# Patient Record
Sex: Female | Born: 1950 | Race: White | Hispanic: No | Marital: Married | Smoking: Never smoker
Health system: Southern US, Community
[De-identification: ages and names within clinical notes are randomized; demographics above are authoritative.]

## PROBLEM LIST (undated history)

## (undated) DIAGNOSIS — E611 Iron deficiency: Secondary | ICD-10-CM

## (undated) DIAGNOSIS — I350 Nonrheumatic aortic (valve) stenosis: Secondary | ICD-10-CM

## (undated) DIAGNOSIS — D649 Anemia, unspecified: Secondary | ICD-10-CM

## (undated) DIAGNOSIS — K219 Gastro-esophageal reflux disease without esophagitis: Secondary | ICD-10-CM

## (undated) DIAGNOSIS — I341 Nonrheumatic mitral (valve) prolapse: Secondary | ICD-10-CM

## (undated) HISTORY — DX: Iron deficiency: E61.1

## (undated) HISTORY — DX: Anemia, unspecified: D64.9

## (undated) HISTORY — DX: Nonrheumatic aortic (valve) stenosis: I35.0

## (undated) HISTORY — DX: Gastro-esophageal reflux disease without esophagitis: K21.9

## (undated) HISTORY — DX: Nonrheumatic mitral (valve) prolapse: I34.1

---
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
## 1974-06-24 HISTORY — PX: TUBAL LIGATION: SHX77

## 2002-06-24 HISTORY — PX: GASTRIC BYPASS: SHX52

## 2011-10-28 ENCOUNTER — Other Ambulatory Visit: Payer: Self-pay | Admitting: Family Medicine

## 2011-10-28 DIAGNOSIS — Z1231 Encounter for screening mammogram for malignant neoplasm of breast: Secondary | ICD-10-CM

## 2011-11-05 ENCOUNTER — Ambulatory Visit
Admission: RE | Admit: 2011-11-05 | Discharge: 2011-11-05 | Disposition: A | Discharge status: Home / Self Care | Payer: Managed Care, Other (non HMO) | Source: Ambulatory Visit | Attending: Family Medicine | Admitting: Family Medicine

## 2011-11-05 DIAGNOSIS — Z1231 Encounter for screening mammogram for malignant neoplasm of breast: Secondary | ICD-10-CM

## 2014-04-20 ENCOUNTER — Other Ambulatory Visit: Payer: Self-pay | Admitting: Family Medicine

## 2014-04-20 DIAGNOSIS — Z1231 Encounter for screening mammogram for malignant neoplasm of breast: Secondary | ICD-10-CM

## 2014-05-05 ENCOUNTER — Ambulatory Visit
Admission: RE | Admit: 2014-05-05 | Discharge: 2014-05-05 | Disposition: A | Discharge status: Home / Self Care | Payer: Managed Care, Other (non HMO) | Source: Ambulatory Visit | Attending: Family Medicine | Admitting: Family Medicine

## 2014-05-05 DIAGNOSIS — Z1231 Encounter for screening mammogram for malignant neoplasm of breast: Secondary | ICD-10-CM

## 2014-05-09 ENCOUNTER — Other Ambulatory Visit: Payer: Self-pay | Admitting: Family Medicine

## 2014-05-09 DIAGNOSIS — R928 Other abnormal and inconclusive findings on diagnostic imaging of breast: Secondary | ICD-10-CM

## 2014-05-17 ENCOUNTER — Ambulatory Visit
Admission: RE | Admit: 2014-05-17 | Discharge: 2014-05-17 | Disposition: A | Discharge status: Home / Self Care | Payer: Managed Care, Other (non HMO) | Source: Ambulatory Visit | Attending: Family Medicine | Admitting: Family Medicine

## 2014-05-17 DIAGNOSIS — R928 Other abnormal and inconclusive findings on diagnostic imaging of breast: Secondary | ICD-10-CM

## 2014-08-12 ENCOUNTER — Telehealth (HOSPITAL_COMMUNITY): Payer: Self-pay | Admitting: *Deleted

## 2014-08-12 ENCOUNTER — Ambulatory Visit (INDEPENDENT_AMBULATORY_CARE_PROVIDER_SITE_OTHER): Payer: BLUE CROSS/BLUE SHIELD | Admitting: Cardiology

## 2014-08-12 ENCOUNTER — Encounter: Payer: Self-pay | Admitting: Cardiology

## 2014-08-12 VITALS — BP 130/76 | HR 60 | Ht 63.0 in | Wt 147.8 lb

## 2014-08-12 DIAGNOSIS — R0789 Other chest pain: Secondary | ICD-10-CM

## 2014-08-12 DIAGNOSIS — R011 Cardiac murmur, unspecified: Secondary | ICD-10-CM | POA: Insufficient documentation

## 2014-08-12 DIAGNOSIS — R0600 Dyspnea, unspecified: Secondary | ICD-10-CM

## 2014-08-12 DIAGNOSIS — R072 Precordial pain: Secondary | ICD-10-CM

## 2014-08-12 NOTE — Patient Instructions (Signed)
Your physician recommends that you schedule a follow-up appointment in: 2 months with Dr. Antoine PocheHochrein  We have ordered an echo and a stress test for you to get done

## 2014-08-12 NOTE — Progress Notes (Signed)
Cardiology Office Note   Date:  08/12/2014   ID:  Vicki ChoughVirginia Hayes, DOB 1950-07-12, MRN [id number]  PCP:  Lilia ArgueKAPLAN,KRISTEN, PA-C  Cardiologist:   Rollene RotundaJames Ezrie Bunyan, MD   Chief Complaint  Patient presents with  . Heart Murmur      History of Present Illness: Vicki Hayes is a 64 y.o. female who presents for follow-up of mitral valve prolapse. She Phen Fen in the past and [place]. She was told she had mitral valve prolapse apparently some regurgitation. She has not had an echocardiogram greater than 5 years. Recently she has had an extensive workup for fatigue. She is a Financial controllerflight attendant. She has noticed increasing tiredness. She's had some dyspnea with exertion such [place] airport. She has some mild chest discomfort with all of this. She has been found to be anemic with iron deficiency. She recently had a complete upper and lower endoscopy without clear etiology. She does not have any resting symptoms. She is not describing palpitations, presyncope or syncope. She is not describing PND or orthopnea. She has had gastric bypass surgery in the past and lost about 125 pounds total. She's had no new dramatic weight gain or edema.   Past Medical History  Diagnosis Date  . Hypertension   . Anemia     Past Surgical History  Procedure Laterality Date  . Gastric bypass  06/2002  . Tubal ligation  1976  . Tonsillectomy  1963     Current Outpatient Prescriptions  Medication Sig Dispense Refill  . citalopram (CELEXA) 20 MG tablet Take 1 tablet by mouth daily.    . clonazePAM (KLONOPIN) 0.5 MG tablet Take 1 tablet by mouth as needed.    . Ferrous Sulfate (IRON) 325 (65 FE) MG TABS Take 1 tablet by mouth 2 (two) times daily.    Marland Kitchen. omeprazole (PRILOSEC) 40 MG capsule Take 1 capsule by mouth daily.    Marland Kitchen. topiramate (TOPAMAX) 50 MG tablet Take 1 tablet by mouth daily.    Marland Kitchen. ibuprofen (ADVIL,MOTRIN) 800 MG tablet Take 1 tablet by mouth  as needed.  0   No current facility-administered medications for this visit.    Allergies:   Review of patient's allergies indicates no known allergies.    Social History:  The patient  reports that she has never smoked. She does not have any smokeless tobacco history on file. She reports that she drinks alcohol. She reports that she does not use illicit drugs.   Family History:  The patient's family history includes Cancer in her father and mother.    ROS:  Please see the history of present illness.   Otherwise, review of systems are positive for reflux, cramping, leg pain..   All other systems are reviewed and negative.    PHYSICAL EXAM: VS:  BP 130/76 mmHg  Pulse 60  Ht 5\' 3"  (1.6 m)  Wt 147 lb 12.8 oz (67.042 kg)  BMI 26.19 kg/m2 , BMI Body mass index is 26.19 kg/(m^2). GENERAL:  Well appearing HEENT:  Pupils equal round and reactive, fundi not visualized, oral mucosa unremarkable NECK:  No jugular venous distention, waveform within normal limits, carotid upstroke brisk and symmetric, no bruits, no thyromegaly LYMPHATICS:  No cervical, inguinal adenopathy LUNGS:  Clear to auscultation bilaterally BACK:  No CVA tenderness CHEST:  Unremarkable HEART:  PMI not displaced or sustained,S1 and S2 within normal limits, no S3, no S4, no clicks, no rubs, 3 out of 6 apical  systolic murmur radiating out the aortic outflow tract and into the left carotid, somewhat radiating to the axilla, it seems to be somewhat holosystolic, no diastolic murmurs ABD:  Flat, positive bowel sounds normal in frequency in pitch, no bruits, no rebound, no guarding, no midline pulsatile mass, no hepatomegaly, no splenomegaly EXT:  2 plus pulses throughout, no edema, no cyanosis no clubbing SKIN:  No rashes no nodules NEURO:  Cranial nerves II through XII grossly intact, motor grossly intact throughout PSYCH:  Cognitively intact, oriented to person place and time    EKG:  EKG is ordered today. The ekg ordered  today demonstrates sinus rhythm, rate 60, axis within normal limits, intervals within normal limits, no acute ST-T wave changes.   Recent Labs: No results found for requested labs within last 365 days.    Lipid Panel No results found for: CHOL, TRIG, HDL, CHOLHDL, VLDL, LDLCALC, LDLDIRECT    Wt Readings from Last 3 Encounters:  08/12/14 147 lb 12.8 oz (67.042 kg)      Other studies Reviewed: Additional studies/ records that were reviewed today include: Outside office notes and labs. Review of the above records demonstrates:  Please see elsewhere in the note.  See above   ASSESSMENT AND PLAN:  MURMUR:  This certainly could be some progressive mitral regurgitation with anterior jet. She will need an echocardiogram to further assess. I will then decide on further management.  CHEST PAIN AND FATIGUE:  She does have some symptoms consistent with angina. I have a low pretest probability.  I will bring the patient back for a POET (Plain Old Exercise Test). This will allow me to screen for obstructive coronary disease, risk stratify and very importantly provide a prescription for exercise.    Current medicines are reviewed at length with the patient today.  The patient does not have concerns regarding medicines.  The following changes have been made:  no change  Labs/ tests ordered today include: POET (Plain Old Exercise Treadmill), echo  No orders of the defined types were placed in this encounter.     Disposition:   FU with me in two months    Signed, Rollene Rotunda, MD  08/12/2014 10:14 AM    [facility]

## 2014-09-01 ENCOUNTER — Encounter (HOSPITAL_COMMUNITY): Payer: Self-pay | Admitting: *Deleted

## 2014-09-08 ENCOUNTER — Telehealth (HOSPITAL_COMMUNITY): Payer: Self-pay

## 2014-09-08 NOTE — Telephone Encounter (Signed)
Encounter complete. 

## 2014-09-09 ENCOUNTER — Telehealth (HOSPITAL_COMMUNITY): Payer: Self-pay

## 2014-09-09 NOTE — Telephone Encounter (Signed)
Encounter complete. 

## 2014-09-13 ENCOUNTER — Ambulatory Visit (HOSPITAL_COMMUNITY)
Admission: RE | Admit: 2014-09-13 | Discharge: 2014-09-13 | Disposition: A | Discharge status: Home / Self Care | Payer: BLUE CROSS/BLUE SHIELD | Source: Ambulatory Visit | Attending: Cardiovascular Disease | Admitting: Cardiovascular Disease

## 2014-09-13 ENCOUNTER — Ambulatory Visit (HOSPITAL_BASED_OUTPATIENT_CLINIC_OR_DEPARTMENT_OTHER)
Admission: RE | Admit: 2014-09-13 | Discharge: 2014-09-13 | Disposition: A | Discharge status: Home / Self Care | Payer: BLUE CROSS/BLUE SHIELD | Source: Ambulatory Visit | Attending: Cardiovascular Disease | Admitting: Cardiovascular Disease

## 2014-09-13 DIAGNOSIS — R0600 Dyspnea, unspecified: Secondary | ICD-10-CM | POA: Insufficient documentation

## 2014-09-13 DIAGNOSIS — R0789 Other chest pain: Secondary | ICD-10-CM | POA: Diagnosis not present

## 2014-09-13 NOTE — Procedures (Signed)
Exercise Treadmill Test  Pre-Exercise Testing Evaluation Rhythm: normal sinus                  Test  Exercise Tolerance Test Ordering MD: Angelina SheriffJake Hochrein, MD    Unique Test No: 1  Treadmill:  1  Indication for ETT: chest pain - rule out ischemia  Contraindication to ETT: No   Stress Modality: exercise - treadmill  Cardiac Imaging Performed: non   Protocol: standard Bruce - maximal  Max BP:  180/124  Max MPHR (bpm):  158 85% MPR (bpm):  134  MPHR obtained (bpm):  153 % MPHR obtained:  97  Reached 85% MPHR (min:sec):  6:50 Total Exercise Time (min-sec):  8  Workload in METS:  10.1 Borg Scale: 15  Reason ETT Terminated:  Marked Diastolic HTN and SOB    ST Segment Analysis At Rest: normal ST segments - no evidence of significant ST depression With Exercise: no evidence of significant ST depression  Other Information Arrhythmia:  No Angina during ETT:  absent (0) Quality of ETT:  diagnostic  ETT Interpretation:  normal - no evidence of ischemia by ST analysis  Comments: Nl GXT  Recommendations: Follow up with Dr. Antoine PocheHochrein

## 2014-09-13 NOTE — Progress Notes (Signed)
2D Echo Performed 09/13/2014    Nusayba Cadenas, RCS  

## 2014-09-15 ENCOUNTER — Telehealth: Payer: Self-pay | Admitting: Cardiology

## 2014-09-15 NOTE — Progress Notes (Signed)
Patient scheduled for OV 4/18 @ 9am

## 2014-09-16 NOTE — Telephone Encounter (Signed)
Close encounter 

## 2014-10-10 ENCOUNTER — Ambulatory Visit: Payer: BLUE CROSS/BLUE SHIELD | Admitting: Cardiology

## 2014-10-13 ENCOUNTER — Encounter: Payer: Self-pay | Admitting: Cardiology

## 2014-10-13 ENCOUNTER — Ambulatory Visit (INDEPENDENT_AMBULATORY_CARE_PROVIDER_SITE_OTHER): Payer: BLUE CROSS/BLUE SHIELD | Admitting: Cardiology

## 2014-10-13 VITALS — BP 134/70 | HR 72 | Ht 63.0 in | Wt 149.9 lb

## 2014-10-13 DIAGNOSIS — R0602 Shortness of breath: Secondary | ICD-10-CM

## 2014-10-13 DIAGNOSIS — R0600 Dyspnea, unspecified: Secondary | ICD-10-CM | POA: Diagnosis not present

## 2014-10-13 NOTE — Progress Notes (Signed)
Cardiology Office Note   Date:  10/13/2014   ID:  Berline Chough, DOB Feb 04, 1951, MRN [id number]  PCP:  Lilia Argue  Cardiologist:   Rollene Rotunda, MD   Chief Complaint  Patient presents with  . Follow-up      History of Present Illness:  Vicki Hayes is a 64 y.o. female who presents for follow-up of mitral valve prolapse. She Phen Fen in the past.  After our first visit I did send her for an echo which demonstrated some moderate mitral regurgitation. She was having some occasional chest discomfort and still has this. However, this has been a chronic issue. She thinks it might be related to some GI problems she has. She might bring this on with activity but also happens at rest. However, I sent her for a POET (Plain Old Exercise Treadmill) And this was negative except for a hypertensive response. She has not been particularly active. 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 She will get dyspneic if she has to move quickly through the airport this has been a chronic pattern. She's not describing PND or orthopnea.   Past Medical History  Diagnosis Date  . Low iron   . GERD (gastroesophageal reflux disease)   . MVP (mitral valve prolapse)   . Anemia     Past Surgical History  Procedure Laterality Date  . Gastric bypass  06/2002  . Tubal ligation  1976  . Tonsillectomy  1963     Current Outpatient Prescriptions  Medication Sig Dispense Refill  . citalopram (CELEXA) 20 MG tablet Take 1 tablet by mouth daily.    . clonazePAM (KLONOPIN) 0.5 MG tablet Take 1 tablet by mouth as needed.    . Ferrous Sulfate (IRON) 325 (65 FE) MG TABS Take 1 tablet by mouth 2 (two) times daily.    Marland Kitchen omeprazole (PRILOSEC) 40 MG capsule Take 1 capsule by mouth daily.    Marland Kitchen topiramate (TOPAMAX) 50 MG tablet Take 1 tablet by mouth daily.     No current facility-administered medications for this visit.    Allergies:   Review of patient's allergies indicates no known  allergies.    ROS:  Please see the history of present illness.   Otherwise, review of systems are positive for reflux, cramping, leg pain..   All other systems are reviewed and negative.    PHYSICAL EXAM: VS:  BP 134/70 mmHg  Pulse 72  Ht  (1.6 m)  Wt 149 lb 14.4 oz (67.994 kg)  BMI 26.56 kg/m2 , BMI Body mass index is 26.56 kg/(m^2). GENERAL:  Well appearing NECK:  No jugular venous distention, waveform within normal limits, carotid upstroke brisk and symmetric, no bruits, no thyromegaly LUNGS:  Clear to auscultation bilaterally BACK:  No CVA tenderness CHEST:  Unremarkable HEART:  PMI not displaced or sustained,S1 and S2 within normal limits, no S3, no S4, no clicks, no rubs, 3 out of 6 apical systolic murmur radiating out the aortic outflow tract and into the left carotid, somewhat radiating to the axilla, it seems to be somewhat holosystolic, no diastolic murmurs ABD:  Flat, positive bowel sounds normal in frequency in pitch, no bruits, no rebound, no guarding, no midline pulsatile mass, no hepatomegaly, no splenomegaly EXT:  2 plus pulses throughout, no edema, no cyanosis no clubbing    Wt Readings from Last 3 Encounters:  10/13/14 149 lb 14.4 oz (67.994 kg)  08/12/14 147 lb 12.8 oz (67.042 kg)  Other studies Reviewed: Additional studies/ records that were reviewed today include: Echo, POET (Plain Old Exercise Treadmill) Review of the above records demonstrates:  Please see elsewhere in the note.  See above   ASSESSMENT AND PLAN:  MURMUR:  I will follow her in about 6 months. Probably repeat an echo 1 year from now.  CHEST PAIN AND FATIGUE:  We had a long discussion about this. At this point the pain is atypical for angina. It's a chronic pattern. She had a negative exercise treadmill test. She's going to continue to follow with her primary provider. If she has any increasing symptoms she will let me know. She is going to slowly increase her activity and start  exercise and let me know if she gets worse rather than better.  Current medicines are reviewed at length with the patient today.  The patient does not have concerns regarding medicines.  The following changes have been made:  no change  Labs/ tests ordered today include: None   Disposition:   FU with me in sixmonths    Signed, Rollene RotundaJames Shandora Koogler, MD  10/13/2014 1:12 PM    [facility]

## 2014-10-13 NOTE — Patient Instructions (Signed)
Your physician recommends that you schedule a follow-up appointment in: 4 MONTHS [facility]

## 2015-02-13 ENCOUNTER — Ambulatory Visit: Payer: BLUE CROSS/BLUE SHIELD | Admitting: Cardiology

## 2015-04-19 ENCOUNTER — Ambulatory Visit: Payer: BLUE CROSS/BLUE SHIELD | Admitting: Cardiology

## 2015-05-22 ENCOUNTER — Ambulatory Visit (INDEPENDENT_AMBULATORY_CARE_PROVIDER_SITE_OTHER): Payer: BLUE CROSS/BLUE SHIELD | Admitting: Cardiology

## 2015-05-22 ENCOUNTER — Encounter: Payer: Self-pay | Admitting: Cardiology

## 2015-05-22 VITALS — BP 142/68 | HR 75 | Ht 62.0 in | Wt 151.4 lb

## 2015-05-22 DIAGNOSIS — I340 Nonrheumatic mitral (valve) insufficiency: Secondary | ICD-10-CM

## 2015-05-22 NOTE — Progress Notes (Signed)
Cardiology Office Note   Date:  05/22/2015   ID:  Vicki Hayes, DOB 1951-06-21, MRN [id number]  PCP:  Lilia Argue  Cardiologist:   Rollene Rotunda, MD   No chief complaint on file.     History of Present Illness:  Vicki Hayes is a 64 y.o. female who presents for follow-up of mitral valve prolapse. She Phen Fen in the past.  After our first visit I did send her for an echo which demonstrated some moderate mitral regurgitation. She was having some occasional chest discomfort.  I sent her for a POET (Plain Old Exercise Treadmill).  She had no evidence of ischemia but she had a hypertensive response at the peak of exercise and felt presyncopal. She doesn't typically have this sensation doesn't get herself to that level of exertion. Since I last saw her she denies any new cardiovascular symptoms. The patient denies any new symptoms such as chest discomfort, neck or arm discomfort. There has been no new shortness of breath, PND or orthopnea. There have been no reported palpitations, presyncope or syncope.  She is not having chest discomfort that she was having.    Past Medical History  Diagnosis Date  . Low iron   . GERD (gastroesophageal reflux disease)   . MVP (mitral valve prolapse)   . Anemia     Past Surgical History  Procedure Laterality Date  . Gastric bypass  06/2002  . Tubal ligation  1976  . Tonsillectomy  1963     Current Outpatient Prescriptions  Medication Sig Dispense Refill  . citalopram (CELEXA) 20 MG tablet Take 1 tablet by mouth daily.    . clonazePAM (KLONOPIN) 0.5 MG tablet Take 1 tablet by mouth as needed.    . Ferrous Sulfate (IRON) 325 (65 FE) MG TABS Take 1 tablet by mouth 2 (two) times daily.    Marland Kitchen omeprazole (PRILOSEC) 40 MG capsule Take 1 capsule by mouth daily.     No current facility-administered medications for this visit.    Allergies:   Review of patient's allergies indicates no known allergies.    ROS:  Please see the history  of present illness.   Otherwise, review of systems are negative for other symptoms.   All other systems are reviewed and negative.    PHYSICAL EXAM: VS:  BP 142/68 mmHg  Pulse 75  Ht  (1.575 m)  Wt 151 lb 7 oz (68.692 kg)  BMI 27.69 kg/m2 , BMI Body mass index is 27.69 kg/(m^2). GENERAL:  Well appearing NECK:  No jugular venous distention, waveform within normal limits, carotid upstroke brisk and symmetric, no bruits, no thyromegaly LUNGS:  Clear to auscultation bilaterally BACK:  No CVA tenderness CHEST:  Unremarkable HEART:  PMI not displaced or sustained,S1 and S2 within normal limits, no S3, no S4, no clicks, no rubs, 3 out of 6 apical systolic murmur radiating out the aortic outflow tract and into the left carotid,  radiating to the axilla, holosystolic, no diastolic murmurs ABD:  Flat, positive bowel sounds normal in frequency in pitch, no bruits, no rebound, no guarding, no midline pulsatile mass, no hepatomegaly, no splenomegaly EXT:  2 plus pulses throughout, no edema, no cyanosis no clubbing    Wt Readings from Last 3 Encounters:  05/22/15 151 lb 7 oz (68.692 kg)  10/13/14 149 lb 14.4 oz (67.994 kg)  08/12/14 147 lb 12.8 oz (67.042 kg)      Other studies Reviewed: Additional studies/ records that were reviewed today include: None  Review of the above records demonstrates:     ASSESSMENT AND PLAN:  MURMUR:  I reviewed an echo in March.  I will repeat an echo in March.    CHEST PAIN AND FATIGUE:   There is no evidence of ischemia. Further workup is suggested. We talked about exercise.  Current medicines are reviewed at length with the patient today.  The patient does not have concerns regarding medicines.  The following changes have been made:  no change  Labs/ tests ordered today include: Echo    Disposition:   FU with me in 12 months    Signed, Rollene RotundaJames Navarro Nine, MD  05/22/2015 10:25 AM    [facility]

## 2015-05-22 NOTE — Patient Instructions (Signed)
Your physician wants you to follow-up in: 1 Year. You will receive a reminder letter in the mail two months in advance. If you don't receive a letter, please call our office to schedule the follow-up appointment.  Your physician has requested that you have an echocardiogram. Echocardiography is a painless test that uses sound waves to create images of your heart. It provides your doctor with information about the size and shape of your heart and how well your heart's chambers and valves are working. This procedure takes approximately one hour. There are no restrictions for this procedure. In March

## 2015-09-13 ENCOUNTER — Other Ambulatory Visit: Payer: Self-pay

## 2015-09-13 ENCOUNTER — Ambulatory Visit (HOSPITAL_COMMUNITY): Payer: BLUE CROSS/BLUE SHIELD | Attending: Cardiology

## 2015-09-13 DIAGNOSIS — I352 Nonrheumatic aortic (valve) stenosis with insufficiency: Secondary | ICD-10-CM | POA: Insufficient documentation

## 2015-09-13 DIAGNOSIS — I340 Nonrheumatic mitral (valve) insufficiency: Secondary | ICD-10-CM | POA: Diagnosis present

## 2015-09-13 DIAGNOSIS — I071 Rheumatic tricuspid insufficiency: Secondary | ICD-10-CM | POA: Insufficient documentation

## 2016-10-24 ENCOUNTER — Other Ambulatory Visit: Payer: Self-pay | Admitting: Family Medicine

## 2016-10-24 DIAGNOSIS — Z780 Asymptomatic menopausal state: Secondary | ICD-10-CM

## 2016-10-24 DIAGNOSIS — E2839 Other primary ovarian failure: Secondary | ICD-10-CM

## 2016-10-24 DIAGNOSIS — Z1231 Encounter for screening mammogram for malignant neoplasm of breast: Secondary | ICD-10-CM

## 2016-11-25 ENCOUNTER — Ambulatory Visit
Admission: RE | Admit: 2016-11-25 | Discharge: 2016-11-25 | Disposition: A | Discharge status: Home / Self Care | Payer: BLUE CROSS/BLUE SHIELD | Source: Ambulatory Visit | Attending: Family Medicine | Admitting: Family Medicine

## 2016-11-25 ENCOUNTER — Other Ambulatory Visit: Payer: BLUE CROSS/BLUE SHIELD

## 2016-11-25 DIAGNOSIS — Z1231 Encounter for screening mammogram for malignant neoplasm of breast: Secondary | ICD-10-CM

## 2016-11-26 ENCOUNTER — Ambulatory Visit
Admission: RE | Admit: 2016-11-26 | Discharge: 2016-11-26 | Disposition: A | Discharge status: Home / Self Care | Payer: BLUE CROSS/BLUE SHIELD | Source: Ambulatory Visit | Attending: Family Medicine | Admitting: Family Medicine

## 2016-11-26 DIAGNOSIS — Z780 Asymptomatic menopausal state: Secondary | ICD-10-CM

## 2016-11-26 DIAGNOSIS — E2839 Other primary ovarian failure: Secondary | ICD-10-CM

## 2018-03-04 ENCOUNTER — Other Ambulatory Visit: Payer: Self-pay | Admitting: Family Medicine

## 2018-03-04 DIAGNOSIS — Z1231 Encounter for screening mammogram for malignant neoplasm of breast: Secondary | ICD-10-CM

## 2018-03-05 ENCOUNTER — Telehealth: Payer: Self-pay

## 2018-03-05 NOTE — Telephone Encounter (Signed)
Referral sent to scheduling and notes filed 

## 2018-03-13 ENCOUNTER — Ambulatory Visit
Admission: RE | Admit: 2018-03-13 | Discharge: 2018-03-13 | Disposition: A | Discharge status: Home / Self Care | Payer: Medicare HMO | Source: Ambulatory Visit | Attending: Family Medicine | Admitting: Family Medicine

## 2018-03-13 ENCOUNTER — Ambulatory Visit: Payer: BLUE CROSS/BLUE SHIELD

## 2018-03-13 DIAGNOSIS — Z1231 Encounter for screening mammogram for malignant neoplasm of breast: Secondary | ICD-10-CM

## 2018-03-24 HISTORY — PX: TRANSTHORACIC ECHOCARDIOGRAM: SHX275

## 2018-04-01 ENCOUNTER — Encounter: Payer: Self-pay | Admitting: Cardiology

## 2018-04-01 ENCOUNTER — Ambulatory Visit: Payer: Medicare HMO | Admitting: Cardiology

## 2018-04-01 VITALS — BP 144/80 | HR 67 | Ht 62.0 in | Wt 153.0 lb

## 2018-04-01 DIAGNOSIS — Z8679 Personal history of other diseases of the circulatory system: Secondary | ICD-10-CM | POA: Diagnosis not present

## 2018-04-01 DIAGNOSIS — I350 Nonrheumatic aortic (valve) stenosis: Secondary | ICD-10-CM

## 2018-04-01 DIAGNOSIS — R011 Cardiac murmur, unspecified: Secondary | ICD-10-CM | POA: Diagnosis not present

## 2018-04-01 DIAGNOSIS — R072 Precordial pain: Secondary | ICD-10-CM

## 2018-04-01 NOTE — Patient Instructions (Signed)
Medication Instructions:  Your Physician recommend you continue on your current medication as directed.     If you need a refill on your cardiac medications before your next appointment, please call your pharmacy.   Lab work: None   Testing/Procedures: Your physician has requested that you have an echocardiogram. Echocardiography is a painless test that uses sound waves to create images of your heart. It provides your doctor with information about the size and shape of your heart and how well your heart's chambers and valves are working. This procedure takes approximately one hour. There are no restrictions for this procedure. [address]   Follow-Up: At [facility], you and your health needs are our priority.  As part of our continuing mission to provide you with exceptional heart care, we have created designated Provider Care Teams.  These Care Teams include your primary Cardiologist (physician) and Advanced Practice Providers (APPs -  Physician Assistants and Nurse Practitioners) who all work together to provide you with the care you need, when you need it. You will need a follow up appointment in 2 years.  Please call our office 2 months in advance to schedule this appointment.  You may see Dr. Herbie Baltimore or one of the following Advanced Practice Providers on your designated Care Team:   Theodore Demark, PA-C . Joni Reining, DNP, ANP  Any Other Special Instructions Will Be Listed Below (If Applicable).

## 2018-04-01 NOTE — Progress Notes (Signed)
PCP: Richmond Campbell., PA-C  Clinic Note: Chief Complaint  Patient presents with  . Follow-up    Almost 3-year follow-up for valvular disease, due for echo  . 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    HPI: Vicki Hayes is a 67 y.o. female who is being seen today for the follow-up evaluation of mitral valve prolapse/regurgitation at the request of Richmond Campbell., PA-C.  Vicki Hayes was last seen by Dr. Antoine Poche in this office in November 2016.  She has a history of Fen/Phen use resulting in mitral valve prolapse.  In initial evaluation in March 2016 with an echocardiogram showed moderate mitral regurgitation.  She is also evaluated with a GXT for chest discomfort that did not show any evidence of ischemia. -->  She subsequently had a follow-up echocardiogram in March 2017 that showed only mild aortic stenosis and mild by regurgitation but no significant abnormalities.  Other than moderately dilated right atrium with moderate severe tricuspid regurgitation (without suggestion of pulmonary hypertension)  Recent Hospitalizations: none  Studies Personally Reviewed - (if available, images/films reviewed: From Epic Chart or Care Everywhere)  2D Echo 08/2015:   EF 60-65%. No RWMA. GR 1 DD. Mild AS (mean gradient 10 mmHg). Mod-severe TR. Mild MR. when compared to last echo, tricuspid regurgitation was still moderate to severe another findings are relatively stable.  Interval History: Vicki is doing quite well since her last visit.  She is status post gastric bypass with notable weight loss after the operation.   For the most part she is stable from a cardiac standpoint.  She does note easy fatigue and may be mild exercise intolerance as well as chronic lower extremity edema. Cardiac review of symptoms is essentially negative:  No chest pain or shortness of breath with rest or exertion.  No PND, orthopnea or edema. No  palpitations, lightheadedness, dizziness, weakness or syncope/near syncope. No TIA/amaurosis fugax symptoms. No melena, hematochezia, hematuria, or epstaxis. No claudication.  ROS: A comprehensive was performed. Review of Systems  Constitutional: Negative for malaise/fatigue.  HENT: Negative for congestion and nosebleeds.   Eyes: Negative for blurred vision.  Respiratory: Negative for cough, shortness of breath and wheezing.   Gastrointestinal: Negative for blood in stool, constipation, heartburn, melena and nausea.  Genitourinary: Negative for hematuria.  Musculoskeletal: Negative for back pain and myalgias.  Neurological: Negative for dizziness.  Psychiatric/Behavioral: Negative for depression and memory loss. The patient is nervous/anxious.      I have reviewed and (if needed) personally updated the patient's problem list, medications, allergies, past medical and surgical history, social and family history.   Past Medical History:  Diagnosis Date  . Anemia   . GERD (gastroesophageal reflux disease)   . Low iron   . Mild aortic stenosis by prior echocardiogram   . MVP (mitral valve prolapse)    not seen on Eco 08/2015    Past Surgical History:  Procedure Laterality Date  . 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 TONSILLECTOMY  1963  . TUBAL LIGATION  1976    Current Meds  Medication Sig  . citalopram (CELEXA) 20 MG tablet Take 1 tablet by mouth daily.  . clonazePAM (KLONOPIN) 0.5 MG tablet Take 1 tablet by mouth as needed.  . Cyanocobalamin (VITAMIN B 12 PO) Take 1 tablet by mouth daily.  . Ferrous Sulfate (IRON) 325 (65 FE) MG TABS Take 1 tablet by mouth 2 (two) times daily.  . Magnesium Gluconate 500 (  27 Mg) MG TABS Take 500 mg by mouth 2 times daily.  Marland Kitchen omeprazole (PRILOSEC) 40 MG capsule Take 1 capsule by mouth daily.  Marland Kitchen topiramate (TOPAMAX) 50 MG tablet Take 50 mg by mouth 2 (two) times daily.    No Known Allergies  Social History   Tobacco Use  . Smoking status: Never  Smoker  . Smokeless tobacco: Never Used  Substance Use Topics  . Alcohol use: Yes    Alcohol/week: 28.0 standard drinks    Types: 28 Standard drinks or equivalent per week    Comment: 4 glasses / day  . Drug use: No   Social History   Social History Narrative   Lives at home with husband and granddaughter.  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  Does not get routine exercise.    family history includes Cancer in her father and mother; Leukemia in her mother.  Wt Readings from Last 3 Encounters:  04/01/18 153 lb (69.4 kg)  05/22/15 151 lb 7 oz (68.7 kg)  10/13/14 149 lb 14.4 oz (68 kg)    PHYSICAL EXAM BP (!) 144/80   Pulse 67   Ht 5\' 2"  (1.575 m)   Wt 153 lb (69.4 kg)   SpO2 99%   BMI 27.98 kg/m  Physical Exam  Constitutional: She is oriented to person, place, and time. She appears well-developed and well-nourished. No distress.  Healthy appearing.  Well-groomed  HENT:  Head: Normocephalic and atraumatic.  Eyes: Pupils are equal, round, and reactive to light. Conjunctivae and EOM are normal. No scleral icterus.  Neck: Normal range of motion. No JVD (Difficult assess, but appears to be normal) present.  Cardiovascular: Normal rate and intact distal pulses.  Occasional extrasystoles are present. Exam reveals no gallop and no friction rub.  No murmur heard. Pulmonary/Chest: Breath sounds normal. She is in respiratory distress. She has no wheezes. She has no rales.  Abdominal: Soft. Bowel sounds are normal. She exhibits distension. There is no tenderness. There is no rebound.  Musculoskeletal: Normal range of motion. She exhibits no edema.  Neurological: She is alert and oriented to person, place, and time. No cranial nerve deficit.  Skin: Skin is warm and dry. No erythema.  Psychiatric: She has a normal mood and affect. Her behavior is normal. Judgment and thought content normal.  Vitals reviewed.    Adult ECG Report  Rate: 67;  Rhythm:  normal sinus rhythm and Normal axis, intervals and durations.;   Narrative Interpretation: Normal EKG   Other studies Reviewed: Additional studies/ records that were reviewed today include:  Recent Labs:  n/a     ASSESSMENT / PLAN: Problem List Items Addressed This Visit    H/O mitral valve prolapse (Chronic)    Pending echo ordered today.  Noted back in 2016.      Relevant Orders   ECHOCARDIOGRAM COMPLETE   Mild aortic stenosis by prior echocardiogram - Primary (Chronic)    Has been 2 years since last echo to assess valvular disease. Plan: Recheck 2D echo      Relevant Orders   EKG 12-Lead (Completed)   ECHOCARDIOGRAM COMPLETE   Murmur   Relevant Orders   ECHOCARDIOGRAM COMPLETE   Precordial chest pain    Not exacerbated with activity levels.  Likely noncardiac.         Current medicines are reviewed at length with the patient today.  (+/- concerns) n/a The following changes have been made:  n/sa  Patient Instructions  Medication Instructions:  Your Physician recommend you continue on your current medication as directed.     If you need a refill on your cardiac medications before your next appointment, please call your pharmacy.   Lab work: None   Testing/Procedures: Your physician has requested that you have an echocardiogram. Echocardiography is a painless test that uses sound waves to create images of your heart. It provides your doctor with information about the size and shape of your heart and how well your heart's chambers and valves are working. This procedure takes approximately one hour. There are no restrictions for this procedure. [address]   Follow-Up: At [facility], you and your health needs are our priority.  As part of our continuing mission to provide you with exceptional heart care, we have created designated Provider Care Teams.  These Care Teams include your primary Cardiologist (physician) and Advanced Practice  Providers (APPs -  Physician Assistants and Nurse Practitioners) who all work together to provide you with the care you need, when you need it. You will need a follow up appointment in 2 years.  Please call our office 2 months in advance to schedule this appointment.  You may see Dr. Herbie Baltimore or one of the following Advanced Practice Providers on your designated Care Team:   Theodore Demark, PA-C . Joni Reining, DNP, ANP  Any Other Special Instructions Will Be Listed Below (If Applicable).    Studies Ordered:   Orders Placed This Encounter  Procedures  . EKG 12-Lead  . ECHOCARDIOGRAM COMPLETE      Bryan Lemma, M.D., M.S. Interventional Cardiologist   Pager # [phone] Phone # [phone] [address] [place], [place] [address]   Thank you for choosing Heartcare at [facility]!!

## 2018-04-03 ENCOUNTER — Encounter: Payer: Self-pay | Admitting: Cardiology

## 2018-04-03 NOTE — Assessment & Plan Note (Signed)
Pending echo ordered today.  Noted back in 2016.

## 2018-04-03 NOTE — Assessment & Plan Note (Signed)
Not exacerbated with activity levels.  Likely noncardiac.

## 2018-04-03 NOTE — Assessment & Plan Note (Signed)
Has been 2 years since last echo to assess valvular disease. Plan: Recheck 2D echo

## 2018-04-09 ENCOUNTER — Ambulatory Visit (HOSPITAL_COMMUNITY): Payer: Medicare HMO | Attending: Cardiovascular Disease

## 2018-04-09 ENCOUNTER — Other Ambulatory Visit: Payer: Self-pay

## 2018-04-09 DIAGNOSIS — Z8679 Personal history of other diseases of the circulatory system: Secondary | ICD-10-CM | POA: Diagnosis not present

## 2018-04-09 DIAGNOSIS — I350 Nonrheumatic aortic (valve) stenosis: Secondary | ICD-10-CM

## 2018-04-09 DIAGNOSIS — R011 Cardiac murmur, unspecified: Secondary | ICD-10-CM

## 2019-03-17 ENCOUNTER — Other Ambulatory Visit: Payer: Self-pay | Admitting: Family Medicine

## 2019-03-17 DIAGNOSIS — Z1231 Encounter for screening mammogram for malignant neoplasm of breast: Secondary | ICD-10-CM

## 2019-03-22 ENCOUNTER — Other Ambulatory Visit: Payer: Self-pay | Admitting: Family Medicine

## 2019-03-22 DIAGNOSIS — E2839 Other primary ovarian failure: Secondary | ICD-10-CM

## 2019-05-03 ENCOUNTER — Ambulatory Visit: Payer: Medicare HMO

## 2019-06-04 ENCOUNTER — Other Ambulatory Visit: Payer: Self-pay

## 2019-06-04 ENCOUNTER — Ambulatory Visit
Admission: RE | Admit: 2019-06-04 | Discharge: 2019-06-04 | Disposition: A | Discharge status: Home / Self Care | Payer: Medicare HMO | Source: Ambulatory Visit | Attending: Family Medicine | Admitting: Family Medicine

## 2019-06-04 DIAGNOSIS — Z1231 Encounter for screening mammogram for malignant neoplasm of breast: Secondary | ICD-10-CM

## 2019-06-04 DIAGNOSIS — E2839 Other primary ovarian failure: Secondary | ICD-10-CM

## 2020-02-10 ENCOUNTER — Telehealth: Payer: Self-pay | Admitting: Cardiology

## 2020-02-10 NOTE — Telephone Encounter (Signed)
LVM for patient to return call to get follow up scheduled with [facility] from recall list 

## 2020-04-18 ENCOUNTER — Other Ambulatory Visit: Payer: Self-pay

## 2020-04-18 ENCOUNTER — Encounter: Payer: Self-pay | Admitting: Cardiology

## 2020-04-18 ENCOUNTER — Ambulatory Visit: Payer: Medicare HMO | Admitting: Cardiology

## 2020-04-18 VITALS — BP 150/86 | HR 58 | Ht 62.0 in | Wt 174.0 lb

## 2020-04-18 DIAGNOSIS — R030 Elevated blood-pressure reading, without diagnosis of hypertension: Secondary | ICD-10-CM

## 2020-04-18 DIAGNOSIS — Z8679 Personal history of other diseases of the circulatory system: Secondary | ICD-10-CM | POA: Diagnosis not present

## 2020-04-18 DIAGNOSIS — I350 Nonrheumatic aortic (valve) stenosis: Secondary | ICD-10-CM

## 2020-04-18 NOTE — Patient Instructions (Addendum)
Medication Instructions:  No changes   Other Instructions  For next three months - when you got to [facility] or [facility]  ( anywhere with blood pressures cuff- take and keep  The reading.    Increase exercise and  Monitor eating and diet habits   Lab Work: Not needed   Testing/Procedures: Not needed   Follow-Up: At [facility], you and your health needs are our priority.  As part of our continuing mission to provide you with exceptional heart care, we have created designated Provider Care Teams.  These Care Teams include your primary Cardiologist (physician) and Advanced Practice Providers (APPs -  Physician Assistants and Nurse Practitioners) who all work together to provide you with the care you need, when you need it.    Your next appointment:   3 month(s)  The format for your next appointment:   virtual or in person  Provider:   Bryan Lemma, MD

## 2020-04-18 NOTE — Progress Notes (Signed)
Primary Care Provider: Richmond CampbellKaplan, Kristen W., PA-C Cardiologist: No primary care provider on file. Electrophysiologist: None  Clinic Note: Chief Complaint  Patient presents with   Follow-up    2-year   Cardiac Valve Problem    Mild MR and AS   HPI:    Vicki Hayes is a 69 y.o. female with a PMH below who presents today for 2-year follow-up of her mild aortic stenosis and mitral valve prolapse (diagnosed in the setting of using Fen/Phen for weight loss)..  Problem List Items Addressed This Visit    Mild aortic stenosis by prior echocardiogram - Primary (Chronic)   H/O mitral valve prolapse (Chronic)   Elevated blood pressure reading     Vicki Hayes was last seen on April 01, 2018 -> she was doing quite well at that time.  No major issues.  She had notable weight loss after her gastric bypass surgery.  Still has fatigue and may be mild exercise intolerance with chronic portion of edema. ->  Surveillance echocardiogram ordered.  (Reviewed below)  Recent Hospitalizations: None  Reviewed  CV studies:    The following studies were reviewed today: (if available, images/films reviewed: From Epic Chart or Care Everywhere)  TTE 04/09/2018: EF 55 to 60%.  Borderline diastolic function.  Mild aortic stenosis.  Mild mitral regurgitation without evidence of prolapse.  Mild right atrial dilation.  Mild to moderate TR.  Borderline elevated PA pressures.  Interval History:   Vicki Hayes returns today overall doing pretty well from cardiac standpoint.  She says she tires easily, but is somewhat deconditioned.  Has only been exercising very much.  She has definitely gained some weight in the last 2 years, which goes along with her being somewhat sedentary, especially during the COVID-19 lockdown.  She is very careful been doing much venturing out as result of the Covid fears.  She denies any active cardiac symptoms however besides exertional fatigue.  CV  Review of Symptoms (Summary): no chest pain or dyspnea on exertion positive for - Easy fatigue, exercise intolerance negative for - edema, irregular heartbeat, orthopnea, palpitations, paroxysmal nocturnal dyspnea, rapid heart rate, shortness of breath or Syncope/near syncope or TIA/amaurosis fugax, claudication  The patient does not have symptoms concerning for COVID-19 infection (fever, chills, cough, or new shortness of breath).   REVIEWED OF SYSTEMS   Review of Systems  Constitutional: Negative for malaise/fatigue (Exercise intolerance, not fatigue) and weight loss (Weight gain).  HENT: Negative for congestion and nosebleeds.   Eyes: Negative for blurred vision.  Respiratory: Negative for cough and shortness of breath.   Gastrointestinal: Negative for blood in stool, constipation, heartburn and melena.  Genitourinary: Negative for dysuria and hematuria.  Musculoskeletal: Positive for joint pain.  Neurological: Positive for dizziness (Some positional). Negative for headaches.  Psychiatric/Behavioral: Negative for depression and memory loss. The patient is nervous/anxious. The patient does not have insomnia.    I have reviewed and (if needed) personally updated the patient's problem list, medications, allergies, past medical and surgical history, social and family history.   PAST MEDICAL HISTORY   Past Medical History:  Diagnosis Date   Anemia    GERD (gastroesophageal reflux disease)    Low iron    Mild aortic stenosis by prior echocardiogram    MVP (mitral valve prolapse)    not seen on [facility] 08/2015    PAST SURGICAL HISTORY   Past Surgical History:  Procedure Laterality Date   GASTRIC BYPASS  06/2002   TONSILLECTOMY  1963   TRANSTHORACIC ECHOCARDIOGRAM  03/2018   EF 55 to 60%.  Borderline diastolic function.  Mild aortic stenosis.  Mild mitral regurgitation without evidence of prolapse.  Mild right atrial dilation.  Mild to moderate TR.  Borderline elevated PA  pressures.   TUBAL LIGATION  1976    2D Echo 08/2015:   EF 60-65%. No RWMA. GR 1 DD. Mild AS (mean gradient 10 mmHg). Mod-severe TR. Mild MR. when compared to last echo, tricuspid regurgitation was still moderate to severe another findings are relatively stable.  There is no immunization history on file for this patient.  MEDICATIONS/ALLERGIES   Current Meds  Medication Sig   citalopram (CELEXA) 20 MG tablet Take 1 tablet by mouth daily.   clonazePAM (KLONOPIN) 0.5 MG tablet Take 1 tablet by mouth as needed.   Cyanocobalamin (VITAMIN B 12 PO) Take 1 tablet by mouth daily.   GUGGULIPID-BLACK PEPPER PO Take 10 mg by mouth in the morning and at bedtime.   Lactobacillus (PROBIOTIC ACIDOPHILUS PO) Take 100 mg by mouth in the morning and at bedtime.   Magnesium Gluconate 500 (27 Mg) MG TABS Take 500 mg by mouth 2 times daily.   metroNIDAZOLE (METROGEL) 0.75 % vaginal gel Use one applicator q hs for 5 days   omeprazole (PRILOSEC) 40 MG capsule Take 1 capsule by mouth daily.   PREBIOTIC PRODUCT PO Take by mouth daily. 1 cap   topiramate (TOPAMAX) 50 MG tablet Take 50 mg by mouth 2 (two) times daily.   Turmeric (QC TUMERIC COMPLEX PO) Take 300 mg by mouth in the morning and at bedtime.    No Known Allergies  SOCIAL HISTORY/FAMILY HISTORY   Reviewed in Epic:  Pertinent findings: None  OBJCTIVE -PE, EKG, labs   Wt Readings from Last 3 Encounters:  04/18/20 174 lb (78.9 kg)  04/01/18 153 lb (69.4 kg)  05/22/15 151 lb 7 oz (68.7 kg)    Physical Exam: BP (!) 150/86    Pulse (!) 58    Ht 5\' 2"  (1.575 m)    Wt 174 lb (78.9 kg)    BMI 31.83 kg/m  Physical Exam Vitals reviewed.  Constitutional:      General: She is not in acute distress.    Appearance: Normal appearance. She is obese. She is not ill-appearing or toxic-appearing.     Comments: Healthy-appearing.  Well-groomed.  A little anxious.  Has gained weight.  HENT:     Head: Normocephalic and atraumatic.  Neck:      Vascular: No carotid bruit, hepatojugular reflux or JVD.  Cardiovascular:     Rate and Rhythm: Regular rhythm. Bradycardia present. Occasional extrasystoles are present.    Chest Wall: PMI is not displaced.     Pulses: Normal pulses and intact distal pulses. No midsystolic click and no opening snap.     Heart sounds: Murmur (Soft 1/6 COPD SEM at RUSB.  No HSM) heard.  No friction rub. No gallop.   Pulmonary:     Effort: Pulmonary effort is normal. No respiratory distress.     Breath sounds: Normal breath sounds. No wheezing, rhonchi or rales.  Chest:     Chest wall: No tenderness.  Musculoskeletal:        General: No swelling. Normal range of motion.     Cervical back: Normal range of motion and neck supple.  Skin:    General: Skin is warm and dry.  Neurological:     General: No focal deficit present.  Mental Status: She is alert and oriented to person, place, and time. Mental status is at baseline.  Psychiatric:        Mood and Affect: Mood normal.        Behavior: Behavior normal.        Thought Content: Thought content normal.        Judgment: Judgment normal.      Adult ECG Report  Rate: 58 ;  Rhythm: normal sinus rhythm and Normal axis, intervals and durations.;   Narrative Interpretation: Normal EKG  Recent Labs: Care Everywhere Component 03/17/19 03/04/18  LDL Direct 132High 137High  Total Cholesterol 220High 232High  Triglycerides 77 108  HDL Cholesterol 79 95  Total Chol / HDL Cholesterol 2.8 2.4  Non-HDL Cholesterol 141  137    Component 03/17/19 03/04/18  Sodium 140 141  Potassium 4.0 4.6  Chloride 108 105  CO2 27 30  BUN 11 12  Glucose 93 87  Creatinine 0.84 0.91  Calcium 9.0 9.0  Total Protein 6.2 6.1  Albumin  4.1 3.9  Total Bilirubin 1.0 0.8  Alkaline Phosphatase 64 61  AST (SGOT) 19 18  ALT (SGPT) 18 17  Anion Gap 5 6  Est. GFR Non-African American 72  66     In Epic No results found for: CHOL, HDL, LDLCALC, LDLDIRECT, TRIG,  CHOLHDL No results found for: CREATININE, BUN, NA, K, CL, CO2 No results found for: TSH  ASSESSMENT/PLAN    Problem List Items Addressed This Visit    Mild aortic stenosis by prior echocardiogram - Primary (Chronic)    Pretty stable finding in 2019.  I think we can wait until 2022 to recheck.  Murmur sounds relatively benign.  Weeks from this year try to get her blood pressure controlled.      Relevant Orders   EKG 12-Lead (Completed)   H/O mitral valve prolapse (Chronic)    This diagnosis was probably spuriously given the setting of Fen/Phen.  It is possible that there was some prolapse, but nothing noted on follow-up echoes.  She does have mild MR, but not auscultated on exam.      Elevated blood pressure reading    Pressure is high today.  She is not sure what he usually runs at home.  Has not been checking it.  I have asked that she monitor [facility] and keep a BP [facility].  We will reassess in 3 months.  She is not currently on any blood pressure medications I would like to try to avoid doing so unless her pressures are still elevated.      Relevant Orders   EKG 12-Lead (Completed)      COVID-19 Education: The signs and symptoms of COVID-19 were discussed with the patient and how to seek care for testing (follow up with PCP or arrange E-visit).   The importance of social distancing and COVID-19 vaccination was discussed today.  The patient is practicing social distancing & Masking.   I spent a total of with the patient spent in direct patient consultation.  Additional time spent with chart review  / charting (studies, outside notes, etc): 12--> 2 years of chart reviewed for Relative data. - Labs found in Care Everywhere  Total Time: 41 min   Current medicines are reviewed at length with the patient today.  (+/- concerns) n/a  This visit occurred during the SARS-CoV-2 public health emergency.  Safety protocols were in place, including screening questions  prior to the  visit, additional usage of staff PPE, and extensive cleaning of exam room while observing appropriate contact time as indicated for disinfecting solutions.  Notice: This dictation was prepared with Dragon dictation along with smaller phrase technology. Any transcriptional errors that result from this process are unintentional and may not be corrected upon review.  Patient Instructions / Medication Changes & Studies & Tests Ordered   Patient Instructions  Medication Instructions:  No changes   Other Instructions  For next three months - when [facility]  ( anywhere with blood pressures cuff- take and keep  The reading.    Increase exercise and  Monitor eating and diet habits   Lab Work: Not needed   Testing/Procedures: Not needed   Follow-Up: At [facility], you and your health needs are our priority.  As part of our continuing mission to provide you with exceptional heart care, we have created designated Provider Care Teams.  These Care Teams include your primary Cardiologist (physician) and Advanced Practice Providers (APPs -  Physician Assistants and Nurse Practitioners) who all work together to provide you with the care you need, when you need it.    Your next appointment:   3 month(s)  The format for your next appointment:   virtual or in person  Provider:   Bryan Lemma, MD       Studies Ordered:   Orders Placed This Encounter  Procedures   EKG 12-Lead     Bryan Lemma, M.D., M.S. Interventional Cardiologist   Pager # [phone] Phone # [phone] [address] [place], [place] [address]   Thank you for choosing Heartcare at [facility]!!

## 2020-04-28 ENCOUNTER — Encounter: Payer: Self-pay | Admitting: Cardiology

## 2020-04-28 NOTE — Assessment & Plan Note (Signed)
Pretty stable finding in 2019.  I think we can wait until 2022 to recheck.  Murmur sounds relatively benign.  Weeks from this year try to get her blood pressure controlled.

## 2020-04-28 NOTE — Assessment & Plan Note (Signed)
This diagnosis was probably spuriously given the setting of Fen/Phen.  It is possible that there was some prolapse, but nothing noted on follow-up echoes.  She does have mild MR, but not auscultated on exam.

## 2020-04-28 NOTE — Assessment & Plan Note (Signed)
Pressure is high today.  She is not sure what he usually runs at home.  Has not been checking it.  I have asked that she monitor [facility] and keep a BP log.  We will reassess in 3 months.  She is not currently on any blood pressure medications I would like to try to avoid doing so unless her pressures are still elevated.

## 2020-06-29 ENCOUNTER — Other Ambulatory Visit: Payer: Self-pay | Admitting: Family Medicine

## 2020-06-29 DIAGNOSIS — Z1231 Encounter for screening mammogram for malignant neoplasm of breast: Secondary | ICD-10-CM

## 2020-07-20 ENCOUNTER — Ambulatory Visit: Payer: Medicare HMO | Admitting: Cardiology

## 2020-07-20 ENCOUNTER — Other Ambulatory Visit: Payer: Self-pay

## 2020-07-20 ENCOUNTER — Encounter: Payer: Self-pay | Admitting: Cardiology

## 2020-07-20 VITALS — BP 140/72 | HR 72 | Ht 62.0 in | Wt 166.2 lb

## 2020-07-20 DIAGNOSIS — R011 Cardiac murmur, unspecified: Secondary | ICD-10-CM

## 2020-07-20 DIAGNOSIS — I350 Nonrheumatic aortic (valve) stenosis: Secondary | ICD-10-CM | POA: Diagnosis not present

## 2020-07-20 DIAGNOSIS — E7849 Other hyperlipidemia: Secondary | ICD-10-CM | POA: Insufficient documentation

## 2020-07-20 DIAGNOSIS — R030 Elevated blood-pressure reading, without diagnosis of hypertension: Secondary | ICD-10-CM | POA: Diagnosis not present

## 2020-07-20 DIAGNOSIS — Z8679 Personal history of other diseases of the circulatory system: Secondary | ICD-10-CM | POA: Diagnosis not present

## 2020-07-20 NOTE — Progress Notes (Signed)
Primary Care Provider: Richmond Campbell., PA-C Cardiologist: No primary care provider on file. Electrophysiologist: None  Clinic Note: Chief Complaint  Patient presents with  . Follow-up    Doing well.  Working on losing weight.  Borderline BP.  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  Would be due to follow-up in the fall of this year.  Murmur sounds relatively benign.  As long as it stays mild, would simply follow every 3 years.  Plan: 2D echo in October       Relevant Orders   ECHOCARDIOGRAM COMPLETE   H/O mitral valve prolapse (Chronic)    I do not think she had any evidence of mitral prolapse or MR on her last echo.  Nothing on exam.  We are following her aortic stenosis with an echo this fall (October).      Relevant Orders   ECHOCARDIOGRAM COMPLETE   Elevated blood pressure reading (Chronic)    She has had borderline blood pressures, but today was 140/72 which by the new guidelines would be considered hypertension.  She tells me at home her pressures range from 134/84-140 3/90.  This would be considered stage I hypertension.  She really wants to avoid taking a blood pressure medication.  Again dietary modification with exercise and weight loss should help her pressures.  She is going to monitor pressures, I told her that her goal pressures are less than 135/85 on average.  If they are ranging above that, then we probably need to treat.  She was given amlodipine 5 mg daily.  I told her that if she has pressures over the threshold of 145/90, she should take a dose of amlodipine.  If her averages remain above 135/84 mmHg, then we do need to think about treating      Hyperlipidemia due to dietary fat intake (Chronic)    I stressed the importance of lipid management  to her.  She is very reluctant to go on any medications.  I explained that probably the one major feature that could make her error stenosis worse  other than blood pressure would be her lipids.  She really does not want to take a statin.  She does not want to try other agents as well. Her most recent lipids showed LDL of 145.  Target for her should be LDL less than 100.  We discussed OTC options including Krill oil 3 g a day along with co-Q10 and potentially red yeast rice.  We also talked out continued diet and exercise and dietary modifications to avoid lipids. She should be due to get lipids checked in October November timeframe.  If not at goal, I think we need to consider medications.        ===================================  HPI:    Vicki Hayes is a 70 y.o. female with a PMH notable for mild aortic stenosis and mitral prolapse (diagnosed in the setting of Fen/Phen use) as well as hypertension who presents today for 19-month follow-up.  [place] Bhargava was last seen on 04/18/2020-doing well from cardiac standpoint.  She has noted some easy fatigue and exercise intolerance.  Some positional dizziness as well as anxiety and joint pain.  Has gained weight -> Plan was to wait to 2022 to recheck echocardiogram.  We wanted to  reassess blood pressure in 3 months.  Recent Hospitalizations: None  Reviewed  CV studies:    The following studies were reviewed today: (if available, images/films reviewed: From Epic Chart or Care Everywhere) . None:   Interval History:   Vicki Hayes is here today for follow-up overall doing pretty well.  She says that her PCP started amlodipine 5 mg daily for blood pressure, but she has not yet started it.  She really did not want take any medication.  She tells me at home her pressures run anywhere from 134/84-143/90 (based on her log).  She is now making an effort to adjust her diet and has increased her exercise level.  She is  lost 10 pounds.  She is trying to stay off of any additional medications.  She is doing fine overall from a cardiac standpoint with no major complaints.  She is doing well with her exercise denying any chest tightness pressure or dyspnea.  No heart failure symptoms of PND, orthopnea or edema.  Remainder of CV Review of Symptoms (Summary) : no chest pain or dyspnea on exertion positive for - Occasional high BP readings negative for - edema, irregular heartbeat, orthopnea, palpitations, paroxysmal nocturnal dyspnea, rapid heart rate, shortness of breath or Lightheadedness or dizziness, syncope/near syncope or TIA/amaurosis fugax, claudication  The patient does not have symptoms concerning for COVID-19 infection (fever, chills, cough, or new shortness of breath).   REVIEWED OF SYSTEMS   Review of Systems  Constitutional: Positive for weight loss (Intentional 10 pounds). Negative for malaise/fatigue.  HENT: Negative for congestion and nosebleeds.   Respiratory: Negative for cough and sputum production.   Gastrointestinal: Negative for blood in stool, constipation, diarrhea and melena.  Genitourinary: Negative for hematuria.  Musculoskeletal: Positive for joint pain (Mild aches and pains but nothing significant).  Neurological: Negative for dizziness and headaches.  Psychiatric/Behavioral: Negative for memory loss. The patient is not nervous/anxious and does not have insomnia.    I have reviewed and (if needed) personally updated the patient's problem list, medications, allergies, past medical and surgical history, social and family history.   PAST MEDICAL HISTORY   Past Medical History:  Diagnosis Date  . Anemia   . GERD (gastroesophageal reflux disease)   . Low iron   . Mild aortic stenosis by prior echocardiogram   . MVP (mitral valve prolapse)    not seen on [facility] 08/2015    PAST SURGICAL HISTORY   Past Surgical History:  Procedure Laterality Date  . 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  TONSILLECTOMY  1963  . TRANSTHORACIC ECHOCARDIOGRAM  03/2018   EF 55 to 60%.  Borderline diastolic function.  Mild aortic stenosis.  Mild mitral regurgitation without evidence of prolapse.  Mild right atrial dilation.  Mild to moderate TR.  Borderline elevated PA pressures.  . TUBAL LIGATION  1976    There is no immunization history on file for this patient.  MEDICATIONS/ALLERGIES   Current Meds  Medication Sig  . citalopram (CELEXA) 20 MG tablet Take 1 tablet by mouth daily.  . clonazePAM (KLONOPIN) 0.5 MG tablet Take 1 tablet by mouth as needed.  . Cyanocobalamin (VITAMIN B 12 PO) Take 1 tablet by mouth daily.  [place] PEPPER PO Take 10 mg by mouth in the morning and at bedtime.  . Lactobacillus (PROBIOTIC ACIDOPHILUS PO) Take 100 mg by mouth in the morning and at bedtime.  . Magnesium Gluconate 500 (27 Mg) MG TABS Take 500 mg by mouth 2 times daily.  Marland Kitchen  metroNIDAZOLE (METROGEL) 0.75 % vaginal gel Use one applicator q hs for 5 days  . omeprazole (PRILOSEC) 40 MG capsule Take 1 capsule by mouth daily.  Marland Kitchen PREBIOTIC PRODUCT PO Take by mouth daily. 1 cap  . topiramate (TOPAMAX) 50 MG tablet Take 50 mg by mouth 2 (two) times daily.  . Turmeric (QC TUMERIC COMPLEX PO) Take 300 mg by mouth in the morning and at bedtime.    No Known Allergies  SOCIAL HISTORY/FAMILY HISTORY   Reviewed in Epic:  Pertinent findings:  Social History   Tobacco Use  . Smoking status: Never Smoker  . Smokeless tobacco: Never Used  Substance Use Topics  . Alcohol use: Yes    Alcohol/week: 28.0 standard drinks    Types: 28 Standard drinks or equivalent per week    Comment: 4 glasses / day  . Drug use: No   Social History   Social History Narrative   Lives at home with husband and granddaughter.  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  Does not get routine exercise.    OBJCTIVE -PE, EKG, labs   Wt Readings from Last 3 Encounters:  07/20/20 166 lb 3.2 oz (75.4  kg)  04/18/20 174 lb (78.9 kg)  04/01/18 153 lb (69.4 kg)    Physical Exam: BP 140/72   Pulse 72   Ht 5\' 2"  (1.575 m)   Wt 166 lb 3.2 oz (75.4 kg)   SpO2 96%   BMI 30.40 kg/m  Physical Exam Vitals reviewed.  Constitutional:      General: She is not in acute distress.    Appearance: Normal appearance. She is obese. She is not ill-appearing (Healthy-appearing), toxic-appearing or diaphoretic.  HENT:     Head: Normocephalic and atraumatic.  Neck:     Vascular: No carotid bruit, hepatojugular reflux or JVD.  Cardiovascular:     Rate and Rhythm: Normal rate and regular rhythm.  No extrasystoles are present.    Chest Wall: PMI is not displaced.     Pulses: Normal pulses.     Heart sounds: Heart sounds not distant. Murmur (1/6C-D SEM at RUSB.  No HSM heard) heard.  No friction rub. No gallop.   Pulmonary:     Effort: Pulmonary effort is normal. No respiratory distress.     Breath sounds: Normal breath sounds.  Chest:     Chest wall: No tenderness.  Musculoskeletal:        General: No swelling. Normal range of motion.     Cervical back: Normal range of motion and neck supple.  Skin:    General: Skin is warm and dry.  Neurological:     General: No focal deficit present.     Mental Status: She is alert and oriented to person, place, and time. Mental status is at baseline.     Motor: No weakness.     Gait: Gait normal.  Psychiatric:        Mood and Affect: Mood normal.        Behavior: Behavior normal.        Thought Content: Thought content normal.        Judgment: Judgment normal.     Adult ECG Report Not checked  Recent Labs: Labs from September 2020 recorded in last note.  Reviewed.  05/15/2020: A1c 5.1, TSH 3.23 Comprehensive Metabolic Panel Component 05/15/20 03/17/19 03/04/18  Sodium 140 140 141  Potassium 4.6 4.0 4.6  Chloride 106 108 105  CO2 29 27 30  BUN 9 11 12   Glucose 94  93 87  Creatinine 0.95 0.84 0.91  Calcium 9.2 9.0 9.0  Total Protein 6.3  6.2  6.1  Albumin  4.0 4.1 3.9  Total Bilirubin 1.1  1.0 0.8  Alkaline Phosphatase 75 64 61  AST (SGOT) 20 19 18   ALT (SGPT) 16 18 17   Anion Gap 5 5 6   Est. GFR Non-African American 62  72  66    Lipid Profile Component 05/15/20 03/17/19 03/04/18  LDL Direct 145 132High 137High  Total Cholesterol 240 220High 232High  Triglycerides 93 77 108  HDL Cholesterol 86 79 95  Total Chol / HDL Cholesterol 2.8 2.8 2.4   CBC and Differential Component 05/15/20 03/17/19 03/04/18  WBC 3.3 4.2Low 3.6Low  RBC 4.06 3.96Low 4.01Low  Hemoglobin 13.5 13.6 13.0  Hematocrit 40.8 40.5 39.6  MCV 100.3 102.1High 98.8  MCH 33.3 34.2High 32.4High  MCHC 33.2 33.5 32.8Low  RDW 13.9 13.5 14.4  Platelets 198 208 182      No results found for: CHOL, HDL, LDLCALC, LDLDIRECT, TRIG, CHOLHDL No results found for: CREATININE, BUN, NA, K, CL, CO2 No flowsheet data found.  No results found for: TSH  ==================================================  COVID-19 Education: The signs and symptoms of COVID-19 were discussed with the patient and how to seek care for testing (follow up with PCP or arrange E-visit).   The importance of social distancing and COVID-19 vaccination was discussed today. The patient is practicing social distancing & Masking.   I spent a total of 35 minutes with the patient spent in direct patient consultation.  Additional time spent with chart review  / charting (studies, outside notes, etc): 15 min Total Time:   Current medicines are reviewed at length with the patient today.  (+/- concerns) n/a  This visit occurred during the SARS-CoV-2 public health emergency.  Safety protocols were in place, including screening questions prior to the visit, additional usage of staff PPE, and extensive cleaning of exam room while observing appropriate contact time as indicated for disinfecting solutions.  Notice: This dictation was prepared with Dragon dictation  along with smaller phrase technology. Any transcriptional errors that result from this process are unintentional and may not be corrected upon review.  Patient Instructions / Medication Changes & Studies & Tests Ordered   Patient Instructions  Medication Instructions:   UPPER LIMIT OFTARGET BLOOD PRESSURE IS 135/85-   MAY USE amlodipine  FOR 2 TO 3 DAYS AS NEEDED FOR BLOOD PRESSURE SYSTOLIC IS GREATER THAN 150/.  MEDICATION YOU CAN USE TO HELP BRING LDL NUMBERS FISHOIIL TABLETS OR KRILL OIL  3 GRAMS IN A DAY   AND CoQ10 up to 300 mg a day  *If you need a refill on your cardiac medications before your next appointment, please call your pharmacy*   Lab Work:  IN [place] /NOV 2022 - HAVE LABS DONE EITHER AT PRIMARY OR DR Ilija Maxim'S OFFICE CMP, FASTING LIPID  If you have labs (blood work) drawn today and your tests are completely normal, you will receive your results only by: Marland Kitchen MyChart Message (if you have MyChart) OR . A paper copy in the mail If you have any lab test that is abnormal or we need to change your treatment, we will call you to review the results.   Testing/Procedures:  WILL BE SCHEDULE IN OCT 2022 AT [address] Your physician has requested that you have an echocardiogram. Echocardiography is a painless test that uses sound waves  to create images of your heart. It provides your doctor with information about the size and shape of your heart and how well your heart's chambers and valves are working. This procedure takes approximately one hour. There are no restrictions for this procedure.     Follow-Up: At [facility], you and your health needs are our priority.  As part of our continuing mission to provide you with exceptional heart care, we have created designated Provider Care Teams.  These Care Teams include your primary Cardiologist (physician) and Advanced Practice Providers (APPs -  Physician Assistants and Nurse Practitioners) who all work  together to provide you with the care you need, when you need it.  We recommend signing up for the patient portal called "MyChart".  Sign up information is provided on this After Visit Summary.  MyChart is used to connect with patients for Virtual Visits (Telemedicine).  Patients are able to view lab/test results, encounter notes, upcoming appointments, etc.  Non-urgent messages can be sent to your provider as well.   To learn more about what you can do with MyChart, go to [email or web address].    Your next appointment:   10 month(s) NOV 2022  The format for your next appointment:   In Person  Provider:   Bryan Lemmaavid Velencia Lenart, MD   Other Instructions---  see medication instructions    Studies Ordered:   Orders Placed This Encounter  Procedures  . ECHOCARDIOGRAM COMPLETE     Bryan Lemmaavid Alanah Sakuma, M.D., M.S. Interventional Cardiologist   Pager # [phone] Phone # [phone] [address] [place], [place] [address]   Thank you for choosing [facility] at [facility]!!

## 2020-07-20 NOTE — Patient Instructions (Addendum)
Medication Instructions:   UPPER LIMIT OFTARGET BLOOD PRESSURE IS 135/85-   MAY USE amlodipine  FOR 2 TO 3 DAYS AS NEEDED FOR BLOOD PRESSURE SYSTOLIC IS GREATER THAN 150/.  MEDICATION YOU CAN USE TO HELP BRING LDL NUMBERS FISHOIIL TABLETS OR KRILL OIL  3 GRAMS IN A DAY   AND CoQ10 up to 300 mg a day  *If you need a refill on your cardiac medications before your next appointment, please call your pharmacy*   Lab Work:  IN [place] /NOV 2022 - HAVE LABS DONE EITHER AT PRIMARY OR DR HARDING'S OFFICE CMP, FASTING LIPID  If you have labs (blood work) drawn today and your tests are completely normal, you will receive your results only by: Marland Kitchen MyChart Message (if you have MyChart) OR . A paper copy in the mail If you have any lab test that is abnormal or we need to change your treatment, we will call you to review the results.   Testing/Procedures:  WILL BE SCHEDULE IN OCT 2022 AT [address] Your physician has requested that you have an echocardiogram. Echocardiography is a painless test that uses sound waves to create images of your heart. It provides your doctor with information about the size and shape of your heart and how well your heart's chambers and valves are working. This procedure takes approximately one hour. There are no restrictions for this procedure.     Follow-Up: At [facility], you and your health needs are our priority.  As part of our continuing mission to provide you with exceptional heart care, we have created designated Provider Care Teams.  These Care Teams include your primary Cardiologist (physician) and Advanced Practice Providers (APPs -  Physician Assistants and Nurse Practitioners) who all work together to provide you with the care you need, when you need it.  We recommend signing up for the patient portal called "MyChart".  Sign up information is provided on this After Visit Summary.  MyChart is used to connect with patients for Virtual  Visits (Telemedicine).  Patients are able to view lab/test results, encounter notes, upcoming appointments, etc.  Non-urgent messages can be sent to your provider as well.   To learn more about what you can do with MyChart, go to [email or web address].    Your next appointment:   10 month(s) NOV 2022  The format for your next appointment:   In Person  Provider:   Bryan Lemma, MD   Other Instructions---  see medication instructions

## 2020-08-03 ENCOUNTER — Encounter: Payer: Self-pay | Admitting: Cardiology

## 2020-08-03 NOTE — Assessment & Plan Note (Signed)
I do not think she had any evidence of mitral prolapse or MR on her last echo.  Nothing on exam.  We are following her aortic stenosis with an echo this fall (October).

## 2020-08-03 NOTE — Assessment & Plan Note (Signed)
She has had borderline blood pressures, but today was 140/72 which by the new guidelines would be considered hypertension.  She tells me at home her pressures range from 134/84-140 3/90.  This would be considered stage I hypertension.  She really wants to avoid taking a blood pressure medication.  Again dietary modification with exercise and weight loss should help her pressures.  She is going to monitor pressures, I told her that her goal pressures are less than 135/85 on average.  If they are ranging above that, then we probably need to treat.  She was given amlodipine 5 mg daily.  I told her that if she has pressures over the threshold of 145/90, she should take a dose of amlodipine.  If her averages remain above 135/84 mmHg, then we do need to think about treating

## 2020-08-03 NOTE — Assessment & Plan Note (Signed)
I stressed the importance of lipid management to her.  She is very reluctant to go on any medications.  I explained that probably the one major feature that could make her error stenosis worse  other than blood pressure would be her lipids.  She really does not want to take a statin.  She does not want to try other agents as well. Her most recent lipids showed LDL of 145.  Target for her should be LDL less than 100.  We discussed OTC options including Krill oil 3 g a day along with co-Q10 and potentially red yeast rice.  We also talked out continued diet and exercise and dietary modifications to avoid lipids. She should be due to get lipids checked in October November timeframe.  If not at goal, I think we need to consider medications.

## 2020-08-03 NOTE — Assessment & Plan Note (Addendum)
Stable finding on echo in 2019.  Would be due to follow-up in the fall of this year.  Murmur sounds relatively benign.  As long as it stays mild, would simply follow every 3 years.  Plan: 2D echo in October

## 2020-08-09 ENCOUNTER — Other Ambulatory Visit: Payer: Self-pay

## 2020-08-09 ENCOUNTER — Ambulatory Visit
Admission: RE | Admit: 2020-08-09 | Discharge: 2020-08-09 | Disposition: A | Discharge status: Home / Self Care | Payer: Medicare (Managed Care) | Source: Ambulatory Visit | Attending: Family Medicine | Admitting: Family Medicine

## 2020-08-09 DIAGNOSIS — Z1231 Encounter for screening mammogram for malignant neoplasm of breast: Secondary | ICD-10-CM

## 2021-04-19 ENCOUNTER — Ambulatory Visit (HOSPITAL_COMMUNITY): Payer: Medicare (Managed Care) | Attending: Internal Medicine

## 2021-04-19 ENCOUNTER — Other Ambulatory Visit: Payer: Self-pay

## 2021-04-19 DIAGNOSIS — Z8679 Personal history of other diseases of the circulatory system: Secondary | ICD-10-CM | POA: Insufficient documentation

## 2021-04-19 DIAGNOSIS — I350 Nonrheumatic aortic (valve) stenosis: Secondary | ICD-10-CM | POA: Insufficient documentation

## 2021-04-20 LAB — ECHOCARDIOGRAM COMPLETE
AR max vel: 1.7 cm2
AV Area VTI: 1.6 cm2
AV Area mean vel: 1.58 cm2
AV Mean grad: 14 mmHg
AV Peak grad: 23.8 mmHg
Ao pk vel: 2.44 m/s
Area-P 1/2: 4.06 cm2
P 1/2 time: 531 msec
S' Lateral: 2.2 cm

## 2021-04-23 ENCOUNTER — Telehealth: Payer: Self-pay | Admitting: *Deleted

## 2021-04-23 NOTE — Telephone Encounter (Signed)
-----   Message from Marykay Lex, MD sent at 04/21/2021  6:53 PM EDT ----- Follow-up echocardiogram result: Very stable echocardiogram.  Good news!  Hyperdynamic left ventricle with ejection fraction over 75%.  No wall motion normalities.  Aortic valve continues to show evidence of Mild Aortic Stenosis.  Mean gradient stable at 14 mmHg) other variables are also consistent with mild stenosis. ->  No significant change compared to last echo.  --> Very hyperdynamic left ventricle-would suggest that she is quite symptomatic if she were to become dehydrated.  She stays adequately hydrated.  Bryan Lemma, MD

## 2021-04-23 NOTE — Telephone Encounter (Signed)
Attempted to call pt. No answer. Lmtcb.  

## 2021-04-24 ENCOUNTER — Telehealth: Payer: Self-pay | Admitting: *Deleted

## 2021-04-24 NOTE — Telephone Encounter (Signed)
Patient reviewed mychart  result of echo  on 10 30/22

## 2021-04-24 NOTE — Telephone Encounter (Signed)
Opened error

## 2021-04-24 NOTE — Telephone Encounter (Signed)
This is not a patient of [facility] patient. Routing to Dr. Erich Montane nurse at [facility].

## 2021-08-14 IMAGING — MG MM DIGITAL SCREENING BILAT W/ TOMO AND CAD
8 series · 9 of 24 positions shown · non-contrast
Comparison: Previous exam(s).

CLINICAL DATA: Screening.

EXAM:
DIGITAL SCREENING BILATERAL MAMMOGRAM WITH TOMOSYNTHESIS AND CAD
TECHNIQUE: Bilateral screening digital craniocaudal and mediolateral oblique
mammograms were obtained. Bilateral screening digital breast
tomosynthesis was performed. The images were evaluated with
computer-aided detection.

[R MLO synth-2D]
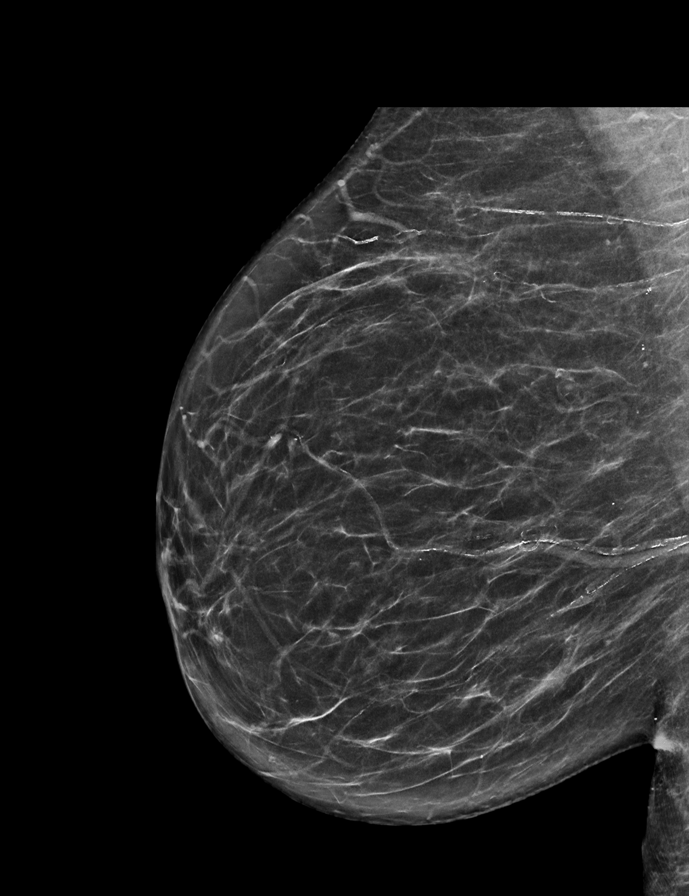

[R CC synth-2D]
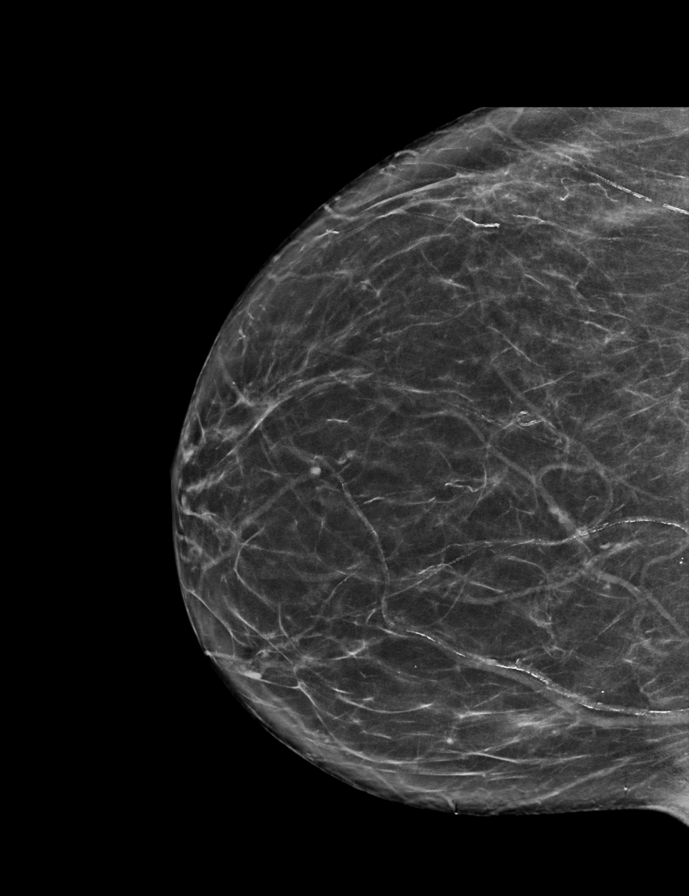

[L MLO synth-2D]
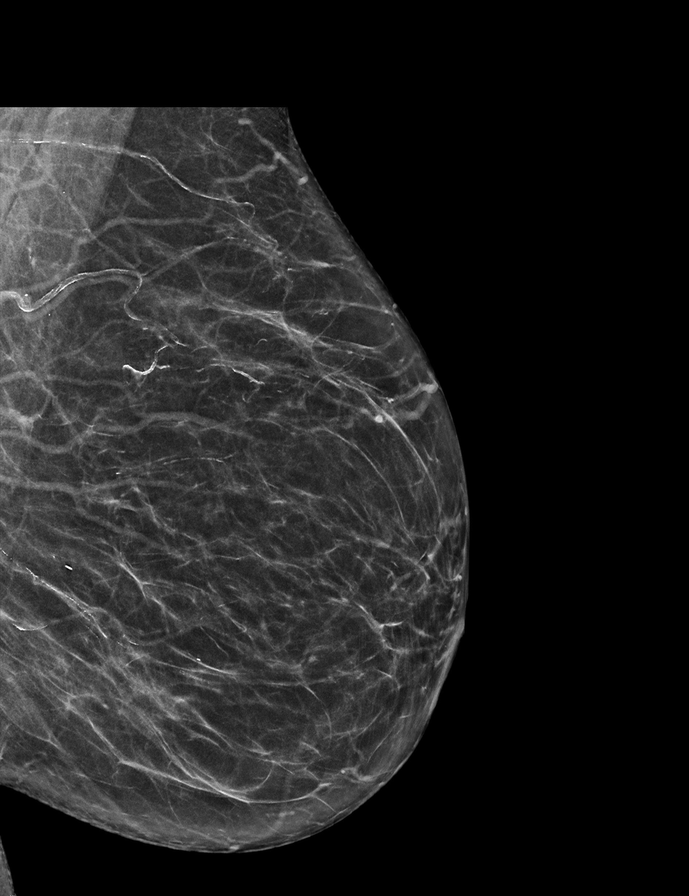

[L CC synth-2D]
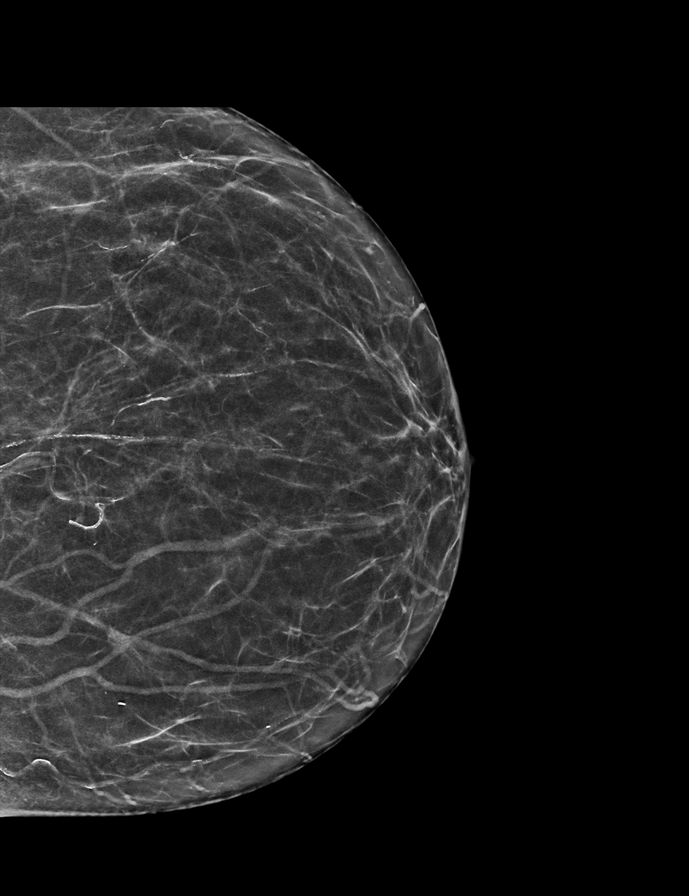

[L MLO tomo · 2 of 63 frames shown]
[frame 21/63]
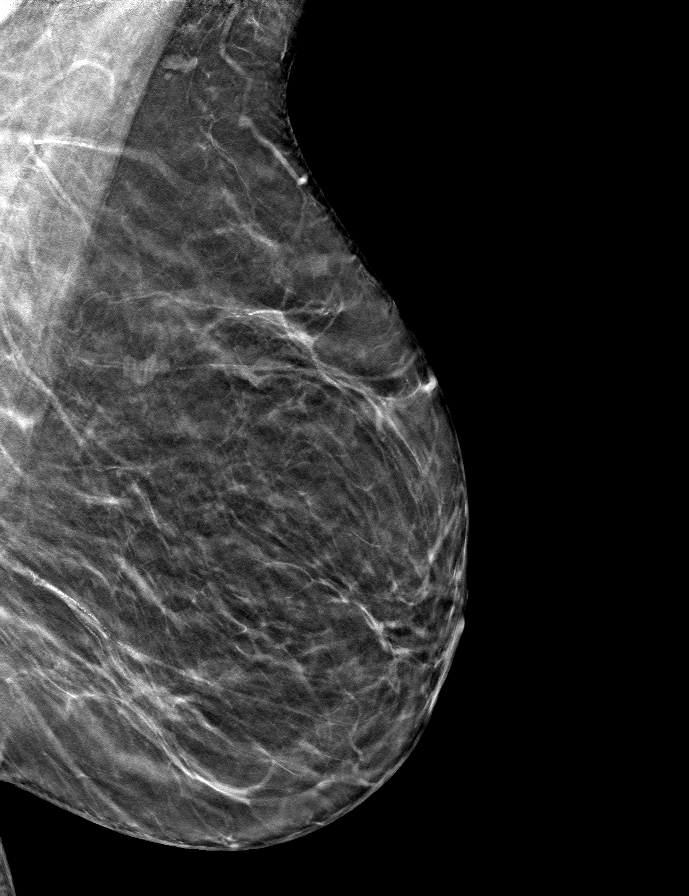
[frame 32/63]
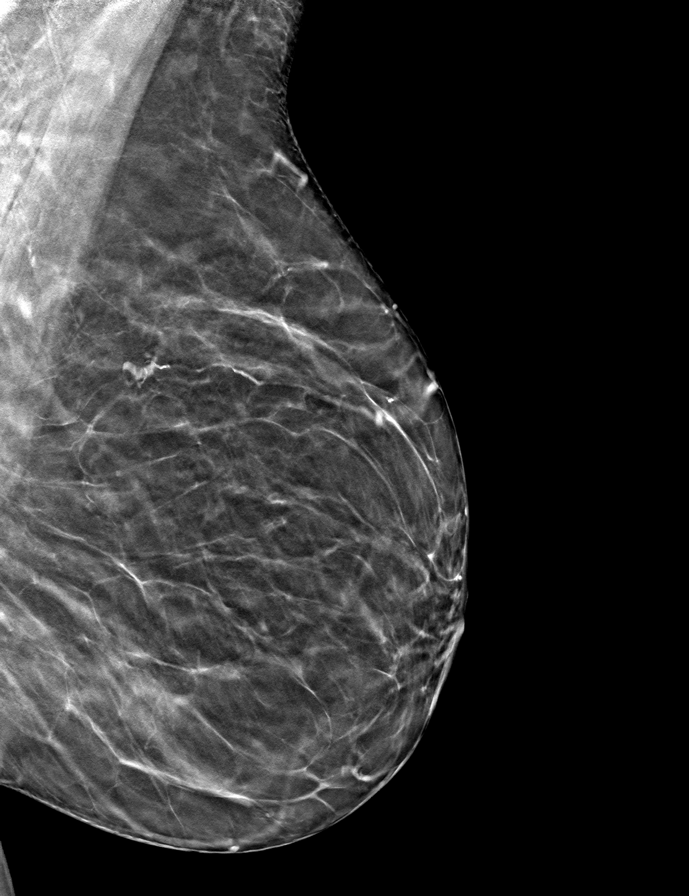

[R MLO tomo · tomo slice 33/65.0]
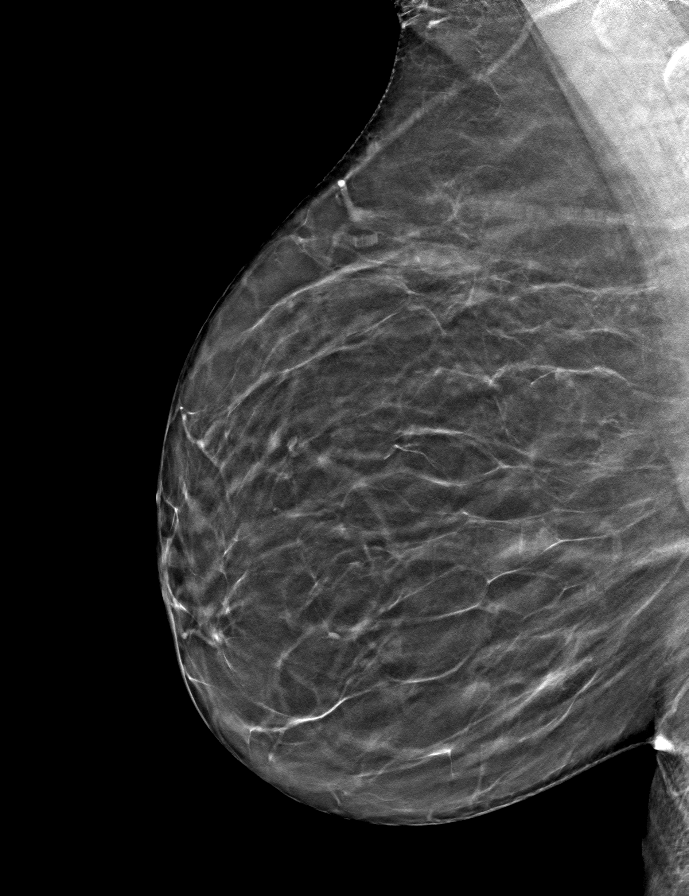

[L CC tomo · tomo slice 28/55.0]
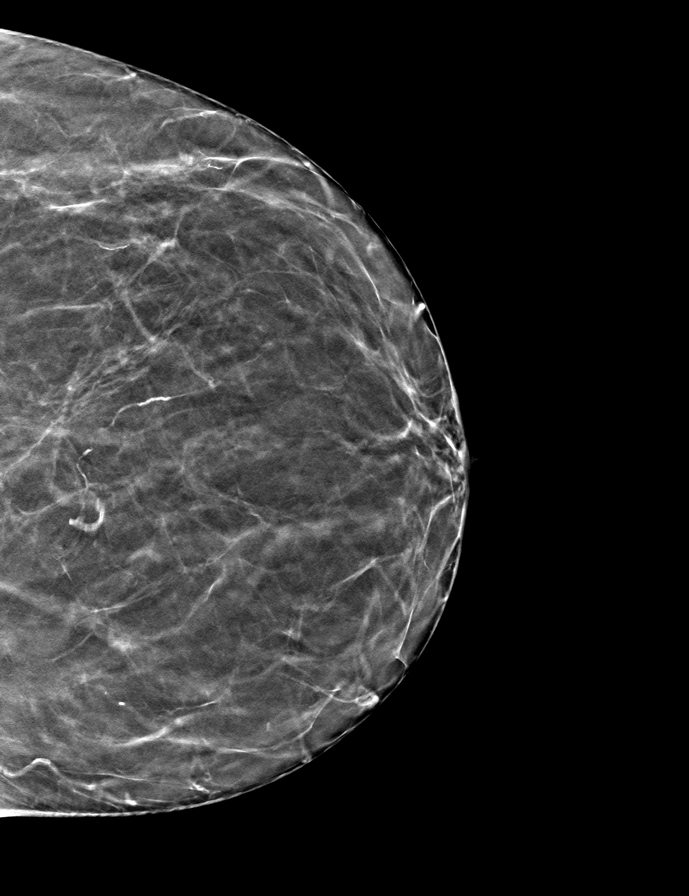

[R CC tomo · tomo slice 31/62.0]
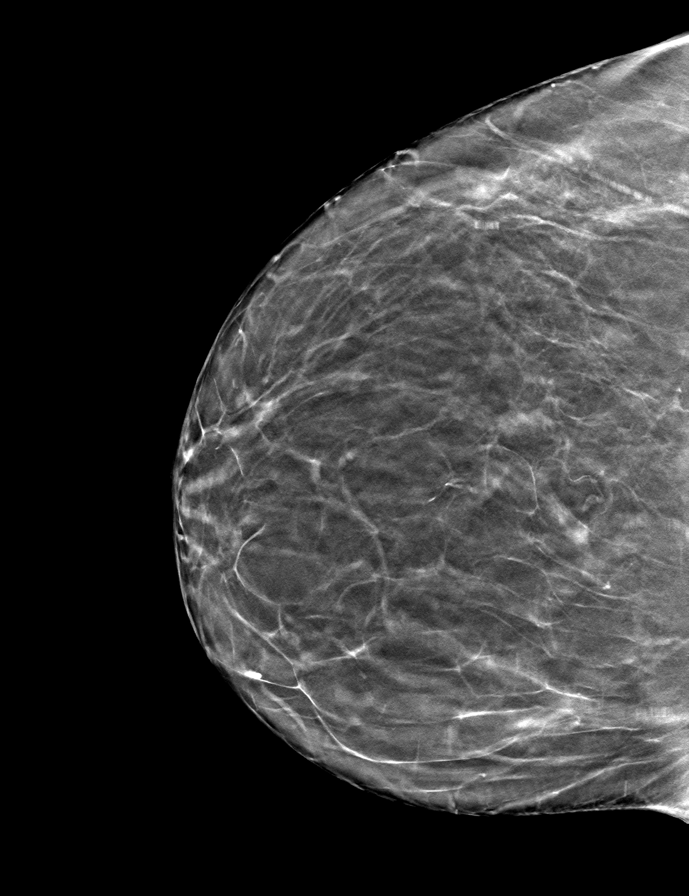

[9 of 24 positions shown; findings below may reference images not displayed]

ACR Breast Density Category b: There are scattered areas of
fibroglandular density.
FINDINGS: There are no findings suspicious for malignancy.
IMPRESSION: No mammographic evidence of malignancy. A result letter of this
screening mammogram will be mailed directly to the patient.

RECOMMENDATION:
Screening mammogram in one year. (Code:51-O-LD2)

BI-RADS CATEGORY  1: Negative.

## 2021-08-28 ENCOUNTER — Telehealth: Payer: Self-pay

## 2021-08-28 NOTE — Telephone Encounter (Signed)
NOTES SCANNED TO REFERRAL 

## 2022-06-21 ENCOUNTER — Telehealth (HOSPITAL_COMMUNITY): Payer: Self-pay | Admitting: Radiology

## 2022-06-21 NOTE — Telephone Encounter (Signed)
Patient needs a call and appointment with Dr. Herbie Baltimore. Needs preoperative evaluation and follow up.

## 2022-06-21 NOTE — Telephone Encounter (Signed)
Spoke with patient who waned to schedule an appointment to be evaluated before her Rt knee replacement on 08/05/22. Her orthopod is Dr. Georgena Spurling. Made appointment with E. Monge for 06/26/22. She also has an appointment with Dr. Herbie Baltimore for 11/25/22. Her last OV was 07/20/2020.

## 2022-06-26 ENCOUNTER — Encounter: Payer: Self-pay | Admitting: Nurse Practitioner

## 2022-06-26 ENCOUNTER — Ambulatory Visit: Payer: PPO | Attending: Nurse Practitioner | Admitting: Nurse Practitioner

## 2022-06-26 VITALS — BP 130/84 | HR 67 | Ht 62.0 in | Wt 172.0 lb

## 2022-06-26 DIAGNOSIS — I350 Nonrheumatic aortic (valve) stenosis: Secondary | ICD-10-CM | POA: Diagnosis not present

## 2022-06-26 DIAGNOSIS — R030 Elevated blood-pressure reading, without diagnosis of hypertension: Secondary | ICD-10-CM

## 2022-06-26 DIAGNOSIS — Z8679 Personal history of other diseases of the circulatory system: Secondary | ICD-10-CM | POA: Diagnosis not present

## 2022-06-26 DIAGNOSIS — E7849 Other hyperlipidemia: Secondary | ICD-10-CM

## 2022-06-26 DIAGNOSIS — Z01810 Encounter for preprocedural cardiovascular examination: Secondary | ICD-10-CM

## 2022-06-26 NOTE — Patient Instructions (Addendum)
Medication Instructions:  Your physician recommends that you continue on your current medications as directed. Please refer to the Current Medication list given to you today.   *If you need a refill on your cardiac medications before your next appointment, please call your pharmacy*   Lab Work: NONE ordered at this time of appointment   If you have labs (blood work) drawn today and your tests are completely normal, you will receive your results only by: [place] (if you have MyChart) OR A paper copy in the mail If you have any lab test that is abnormal or we need to change your treatment, we will call you to review the results.   Testing/Procedures: NONE ordered at this time of appointment     Follow-Up: At [facility], you and your health needs are our priority.  As part of our continuing mission to provide you with exceptional heart care, we have created designated Provider Care Teams.  These Care Teams include your primary Cardiologist (physician) and Advanced Practice Providers (APPs -  Physician Assistants and Nurse Practitioners) who all work together to provide you with the care you need, when you need it.  We recommend signing up for the patient portal called "MyChart".  Sign up information is provided on this After Visit Summary.  MyChart is used to connect with patients for Virtual Visits (Telemedicine).  Patients are able to view lab/test results, encounter notes, upcoming appointments, etc.  Non-urgent messages can be sent to your provider as well.   To learn more about what you can do with MyChart, go to [email or web address].    Your next appointment:    Keep follow up   The format for your next appointment:   In Person  Provider:   Glenetta Hew, MD     Other Instructions Report blood pressure consistently greater than 130/80   Important Information About Sugar

## 2022-06-26 NOTE — Progress Notes (Addendum)
Office Visit    Patient Name: Vicki Hayes Date of Encounter: 06/26/2022  Primary Care Provider:  Aletha Halim., PA-C Primary Cardiologist:  Glenetta Hew, MD  Chief Complaint    72 year old female with a history of mild aortic stenosis, mitral valve prolapse, elevated blood pressure reading, hyperlipidemia, and GERD who presents for follow-up and for preoperative cardiac evaluation for right knee replacement on 08/05/2022 with Dr. Lara Mulch.  Past Medical History    Past Medical History:  Diagnosis Date   Anemia    GERD (gastroesophageal reflux disease)    Low iron    Mild aortic stenosis by prior echocardiogram    MVP (mitral valve prolapse)    not seen on [facility] 08/2015   Past Surgical History:  Procedure Laterality Date   GASTRIC BYPASS  06/2002   [place] ECHOCARDIOGRAM  03/2018   EF 55 to 60%.  Borderline diastolic function.  Mild aortic stenosis.  Mild mitral regurgitation without evidence of prolapse.  Mild right atrial dilation.  Mild to moderate TR.  Borderline elevated PA pressures.   TUBAL LIGATION  1976    Allergies  No Known Allergies  History of Present Illness    72 year old female with the above past medical history including mild aortic stenosis, mitral valve prolapse, elevated blood pressure reading, hyperlipidemia, and GERD.  She has a history of mild aortic stenosis by prior echocardiogram, previously documented mitral valve prolapse.  She was last seen in the office on 07/20/2020 and was stable from a cardiac standpoint.  Her BP was mildly elevated. Repeat echocardiogram in 03/2021 showed EF greater than 75%, hyperdynamic LV function, no RWMA, normal RV systolic function, mild to moderate mitral valve regurgitation, mild to moderate tricuspid valve regurgitation, mild aortic stenosis, mean gradient 14 mmHg, aortic valve regurgitation,  no significant change from prior echo.  Follow-up echocardiogram was  recommended in 2 to 3 years.  She presents today for follow-up and for preoperative cardiac evaluation for right knee replacement on 08/05/2022 with Dr. Lara Mulch. Since her last visit he has been stable from a cardiac standpoint.  Her activity has been somewhat limited in the setting of knee pain.  She denies chest pain, dyspnea, dizziness, presyncope, syncope, edema, PND, orthopnea, weight gain.  She also reports significant personal stress as her husband has Alzheimer's and she is his primary caregiver.  Overall, she reports feeling well.  Home Medications    Current Outpatient Medications  Medication Sig Dispense Refill   omeprazole (PRILOSEC) 20 MG capsule Take 20 mg by mouth daily.     No current facility-administered medications for this visit.     Review of Systems    She denies chest pain, palpitations, dyspnea, pnd, orthopnea, n, v, dizziness, syncope, edema, weight gain, or early satiety. All other systems reviewed and are otherwise negative except as noted above.   Physical Exam    VS:  BP 130/84   Pulse 67   Ht 5\' 2"  (1.575 m)   Wt 172 lb (78 kg)   BMI 31.46 kg/m  GEN: Well nourished, well developed, in no acute distress. HEENT: normal. Neck: Supple, no JVD, carotid bruits, or masses. Cardiac: RRR, 3/6 murmur, no rubs, or gallops. No clubbing, cyanosis, edema.  Radials/DP/PT 2+ and equal bilaterally.  Respiratory:  Respirations regular and unlabored, clear to auscultation bilaterally. GI: Soft, nontender, nondistended, BS + x 4. MS: no deformity or atrophy. Skin: warm and dry, no rash. Neuro:  Strength and sensation  are intact. Psych: Normal affect.  Accessory Clinical Findings    ECG personally reviewed by me today -NSR, 67 bpm- no acute changes.   No results found for: "WBC", "HGB", "HCT", "MCV", "PLT" No results found for: "CREATININE", "BUN", "NA", "K", "CL", "CO2" No results found for: "ALT", "AST", "GGT", "ALKPHOS", "BILITOT" No results found for:  "CHOL", "HDL", "LDLCALC", "LDLDIRECT", "TRIG", "CHOLHDL"  No results found for: "HGBA1C"  Assessment & Plan    1. Aortic stenosis: Most recent echo in 03/2021 showed EF greater than 75%, hyperdynamic LV function, no RWMA, normal RV systolic function, mild to moderate mitral valve regurgitation, mild to moderate tricuspid valve regurgitation, mild aortic stenosis, mean gradient 14 mmHg, aortic valve regurgitation,  no significant change from prior echo. Euvolemic and well compensated on exam.  She denies dizziness, presyncope, syncope.  Encouraged ongoing adequate hydration.  Consider repeat echo in fall 2024.   2. History of mitral valve prolapse/mitral valve regurgitation: Stable on most recent echo as above.  3. Elevated BP: She has a history of elevated BP readings.  BP improved upon recheck today.  I advised her to continue to monitor BP and report BP consistently greater than 140/80.  She is hesitant to start antihypertensive medication.  4. Hyperlipidemia: LDL was 135 in 05/2022.  Monitored and managed per PCP.  Patient declines statin therapy at this time.  Encouraged ongoing lifestyle modifications with diet and exercise.  5. Preoperative cardiac exam: According to the Revised Cardiac Risk Index (RCRI), her Perioperative Risk of Major Cardiac Event is (%): 0.4. Her Functional Capacity in METs is: 5.04 according to the [employer] Activity Status Index (DASI). Therefore, based on ACC/AHA guidelines, patient would be at acceptable risk for the planned procedure without further cardiovascular testing. I will route this recommendation to the requesting party via Epic fax function.  6. Disposition: Follow-up as scheduled with Dr. Ellyn Hack in 11/2022.      Lenna Sciara, NP 06/26/2022, 9:09 AM

## 2022-11-25 ENCOUNTER — Ambulatory Visit: Payer: Medicare (Managed Care) | Admitting: Cardiology

## 2023-04-25 ENCOUNTER — Encounter: Payer: Self-pay | Admitting: Nurse Practitioner

## 2023-04-25 ENCOUNTER — Ambulatory Visit: Payer: PPO | Attending: Nurse Practitioner | Admitting: Nurse Practitioner

## 2023-04-25 VITALS — BP 132/82 | HR 82 | Ht 62.0 in | Wt 179.0 lb

## 2023-04-25 DIAGNOSIS — E785 Hyperlipidemia, unspecified: Secondary | ICD-10-CM | POA: Diagnosis not present

## 2023-04-25 DIAGNOSIS — R0602 Shortness of breath: Secondary | ICD-10-CM

## 2023-04-25 DIAGNOSIS — Z8679 Personal history of other diseases of the circulatory system: Secondary | ICD-10-CM

## 2023-04-25 DIAGNOSIS — I340 Nonrheumatic mitral (valve) insufficiency: Secondary | ICD-10-CM

## 2023-04-25 DIAGNOSIS — I350 Nonrheumatic aortic (valve) stenosis: Secondary | ICD-10-CM | POA: Diagnosis not present

## 2023-04-25 DIAGNOSIS — I2510 Atherosclerotic heart disease of native coronary artery without angina pectoris: Secondary | ICD-10-CM

## 2023-04-25 NOTE — Patient Instructions (Signed)
Medication Instructions:  Your physician recommends that you continue on your current medications as directed. Please refer to the Current Medication list given to you today.  *If you need a refill on your cardiac medications before your next appointment, please call your pharmacy*   Lab Work: NONE ordered at this time of appointment    Testing/Procedures: Your physician has requested that you have an echocardiogram. Echocardiography is a painless test that uses sound waves to create images of your heart. It provides your doctor with information about the size and shape of your heart and how well your heart's chambers and valves are working. This procedure takes approximately one hour. There are no restrictions for this procedure. Please do NOT wear cologne, perfume, aftershave, or lotions (deodorant is allowed). Please arrive 15 minutes prior to your appointment time.  Please note: We ask at that you not bring children with you during ultrasound (echo/ vascular) testing. Due to room size and safety concerns, children are not allowed in the ultrasound rooms during exams. Our front office staff cannot provide observation of children in our lobby area while testing is being conducted. An adult accompanying a patient to their appointment will only be allowed in the ultrasound room at the discretion of the ultrasound technician under special circumstances. We apologize for any inconvenience.    Follow-Up: At [facility], you and your health needs are our priority.  As part of our continuing mission to provide you with exceptional heart care, we have created designated Provider Care Teams.  These Care Teams include your primary Cardiologist (physician) and Advanced Practice Providers (APPs -  Physician Assistants and Nurse Practitioners) who all work together to provide you with the care you need, when you need it.  We recommend signing up for the patient portal called "MyChart".  Sign up  information is provided on this After Visit Summary.  MyChart is used to connect with patients for Virtual Visits (Telemedicine).  Patients are able to view lab/test results, encounter notes, upcoming appointments, etc.  Non-urgent messages can be sent to your provider as well.   To learn more about what you can do with MyChart, go to [email or web address].    Your next appointment:   6 month(s)  Provider:   Bryan Lemma, MD

## 2023-04-25 NOTE — Progress Notes (Addendum)
Office Visit    Patient Name: Vicki Hayes Date of Encounter: 04/25/2023  Primary Care Provider:  Richmond Campbell., PA-C Primary Cardiologist:  Bryan Lemma, MD  Chief Complaint    72 year old female with a history of mild aortic stenosis, mitral valve prolapse, elevated blood pressure reading, hyperlipidemia, and GERD who presents for follow-up related to valvular heart disease.  Past Medical History    Past Medical History:  Diagnosis Date   Anemia    GERD (gastroesophageal reflux disease)    Low iron    Mild aortic stenosis by prior echocardiogram    MVP (mitral valve prolapse)    not seen on [facility] 08/2015   Past Surgical History:  Procedure Laterality Date   GASTRIC BYPASS  06/2002   TONSILLECTOMY  1963   [place]STHORAC[place] ECHOCARDIOGRAM  03/2018   EF 55 to 60%.  Borderline diastolic function.  Mild aortic stenosis.  Mild mitral regurgitation without evidence of prolapse.  Mild right atrial dilation.  Mild to moderate TR.  Borderline elevated PA pressures.   TUBAL LIGATION  1976    Allergies  No Known Allergies   Labs/Other Studies Reviewed    The following studies were reviewed today:  Cardiac Studies & Procedures       ECHOCARDIOGRAM  ECHOCARDIOGRAM COMPLETE 04/19/2021  Narrative ECHOCARDIOGRAM REPORT    Patient Name:   Vicki Hayes Date of Exam: 04/19/2021 Medical Rec #:  [id number]      Height:       62.0 in Accession #:    [id number]     Weight:       166.2 lb Date of Birth:  07/03/1950      BSA:          1.767 m Patient Age:    70 years       BP:           120/75 mmHg Patient Gender: F              HR:           76 bpm. Exam Location:  [address]  Procedure: 2D Echo, 3D Echo, Cardiac Doppler, Color Doppler and Strain Analysis  Indications:    I35.0 aortic stenosis  History:        Patient has prior history of Echocardiogram examinations, most recent 04/09/2018. Aortic Valve Disease and Mitral Valve Disease,  Signs/Symptoms:Murmur; Risk Factors:Dyslipidemia. Previous echo revealed LVEF 60%, with mild AS mean gradient 12 mmHg, mild MR, mild to moderate TR and PAP 32 mmHg.  Sonographer:    Chanetta Marshall BA, RDCS Referring Phys: DAVID W HARDING  IMPRESSIONS   1. Global longitudinal strain is -17.4%.Marland Kitchen Left ventricular ejection fraction, by estimation, is >75%. The left ventricle has hyperdynamic function. The left ventricle has no regional wall motion abnormalities. Left ventricular diastolic parameters were normal. 2. Right ventricular systolic function is normal. The right ventricular size is normal. 3. Mild to moderate mitral valve regurgitation. 4. Tricuspid valve regurgitation is mild to moderate. 5. AV is thickened, calcifed with restircted motion. Peak and mean gradients through the valve are 24 and 14 mm Hg respectively AVA (VTI) is 1.6 cm2 Dimensionless index is 0.45 OVerall consistent with mild AS. Compared to previous echo no significant change in mean gradient. . Aortic valve regurgitation is mild. 6. The inferior vena cava is normal in size with greater than 50% respiratory variability, suggesting right atrial pressure of 3 mmHg.  FINDINGS Left Ventricle: Global longitudinal strain is -17.4%. Left ventricular ejection  fraction, by estimation, is >75%. The left ventricle has hyperdynamic function. The left ventricle has no regional wall motion abnormalities. The left ventricular internal cavity size was normal in size. There is no left ventricular hypertrophy. Left ventricular diastolic parameters were normal.  Right Ventricle: The right ventricular size is normal. Right vetricular wall thickness was not assessed. Right ventricular systolic function is normal.  Left Atrium: Left atrial size was normal in size.  Right Atrium: Right atrial size was normal in size.  Pericardium: There is no evidence of pericardial effusion.  Mitral Valve: There is mild thickening of the mitral valve  leaflet(s). Mild mitral annular calcification. Mild to moderate mitral valve regurgitation.  Tricuspid Valve: The tricuspid valve is normal in structure. Tricuspid valve regurgitation is mild to moderate.  Aortic Valve: AV is thickened, calcifed with restircted motion. Peak and mean gradients through the valve are 24 and 14 mm Hg respectively AVA (VTI) is 1.6 cm2 Dimensionless index is 0.45 OVerall consistent with mild AS. Compared to previous echo no significant change in mean gradient. Aortic valve regurgitation is mild. Aortic regurgitation PHT measures 531 msec. Aortic valve mean gradient measures 14.0 mmHg. Aortic valve peak gradient measures 23.8 mmHg. Aortic valve area, by VTI measures 1.60 cm.  Pulmonic Valve: The pulmonic valve was grossly normal. Pulmonic valve regurgitation is trivial.  Aorta: The aortic root and ascending aorta are structurally normal, with no evidence of dilitation.  Venous: The inferior vena cava is normal in size with greater than 50% respiratory variability, suggesting right atrial pressure of 3 mmHg.  IAS/Shunts: No atrial level shunt detected by color flow Doppler.   LEFT VENTRICLE PLAX 2D LVIDd:         4.10 cm   Diastology LVIDs:         2.20 cm   LV e' medial:    9.36 cm/s LV PW:         0.80 cm   LV E/e' medial:  9.1 LV IVS:        0.70 cm   LV e' lateral:   10.30 cm/s LVOT diam:     2.10 cm   LV E/e' lateral: 8.2 LV SV:         84 LV SV Index:   48        2D Longitudinal Strain LVOT Area:     3.46 cm  2D Strain GLS (A2C):   -17.6 % 2D Strain GLS (A3C):   -14.8 % 2D Strain GLS (A4C):   -19.7 % 2D Strain GLS Avg:     -17.4 %  3D Volume EF: 3D EF:        62 % LV EDV:       78 ml LV ESV:       29 ml LV SV:        49 ml  RIGHT VENTRICLE             IVC RV Basal diam:  3.20 cm     IVC diam: 2.10 cm RV Mid diam:    2.20 cm RV S prime:     14.50 cm/s TAPSE (M-mode): 3.0 cm RVSP:           34.6 mmHg  LEFT ATRIUM             Index         RIGHT ATRIUM           Index LA diam:        3.70 cm  2.09 cm/m   RA Pressure: 3.00 mmHg LA Vol (A2C):   43.4 ml 24.56 ml/m  RA Area:     13.60 cm LA Vol (A4C):   39.4 ml 22.30 ml/m  RA Volume:   32.20 ml  18.22 ml/m LA Biplane Vol: 42.3 ml 23.94 ml/m AORTIC VALVE AV Area (Vmax):    1.70 cm AV Area (Vmean):   1.58 cm AV Area (VTI):     1.60 cm AV Vmax:           244.00 cm/s AV Vmean:          174.333 cm/s AV VTI:            0.526 m AV Peak Grad:      23.8 mmHg AV Mean Grad:      14.0 mmHg LVOT Vmax:         120.00 cm/s LVOT Vmean:        79.650 cm/s LVOT VTI:          0.244 m LVOT/AV VTI ratio: 0.46 AI PHT:            531 msec  AORTA Ao Root diam: 3.00 cm Ao Asc diam:  2.90 cm  MITRAL VALVE                TRICUSPID VALVE MV Area (PHT): 4.06 cm     TR Peak grad:   31.6 mmHg MV Decel Time: 187 msec     TR Vmax:        281.00 cm/s MV E velocity: 84.90 cm/s   Estimated RAP:  3.00 mmHg MV A velocity: 145.00 cm/s  RVSP:           34.6 mmHg MV E/A ratio:  0.59 SHUNTS Systemic VTI:  0.24 m Systemic Diam: 2.10 cm  Dietrich Pates MD Electronically signed by Dietrich Pates MD Signature Date/Time: 04/20/2021/12:10:59 AM    Final            Recent Labs: No results found for requested labs within last 365 days.  Recent Lipid Panel No results found for: "CHOL", "TRIG", "HDL", "CHOLHDL", "VLDL", "LDLCALC", "LDLDIRECT"  History of Present Illness    72 year old female with the above past medical history including mild aortic stenosis, mitral valve prolapse, elevated blood pressure reading, hyperlipidemia, and GERD.   She has a history of mild aortic stenosis by prior echocardiogram, previously documented mitral valve prolapse. Echocardiogram in 03/2021 showed EF greater than 75%, hyperdynamic LV function, no RWMA, normal RV systolic function, mild to moderate mitral valve regurgitation, mild to moderate tricuspid valve regurgitation, mild aortic stenosis, mean gradient 14 mmHg,  aortic valve regurgitation, no significant change from prior echo.  Repeat echocardiogram was recommended in 2 to 3 years.  She was last seen in the office on 06/26/2022 and was stable from a cardiac standpoint.  She was cleared for knee surgery.  Her PCP in September 2024 in the setting of shortness of breath.  CT of the chest was negative for PE.  EKG showed sinus rhythm.    She presents today for follow-up accompanied by her husband. Since her last visit he has been stable from a cardiac standpoint. She denies any exertional dyspnea, she does note mild shortness of breath in the setting of severe anxiety and stress, denies any other symptoms concerning for angina.  Home Medications    Current Outpatient Medications  Medication Sig Dispense Refill   amLODipine (NORVASC) 5 MG tablet Take 5 mg by mouth daily.  Semaglutide-Weight Management 1 MG/0.5ML SOAJ Inject 1 mg into the skin.     omeprazole (PRILOSEC) 20 MG capsule Take 20 mg by mouth daily.     No current facility-administered medications for this visit.     Review of Systems    She denies chest pain, palpitations, dyspnea, pnd, orthopnea, n, v, dizziness, syncope, edema, weight gain, or early satiety. All other systems reviewed and are otherwise negative except as noted above.   Physical Exam    VS:  BP 132/82   Pulse 82   Ht 5\' 2"  (1.575 m)   Wt 179 lb (81.2 kg)   SpO2 99%   BMI 32.74 kg/m  GEN: Well nourished, well developed, in no acute distress. HEENT: normal. Neck: Supple, no JVD, carotid bruits, or masses. Cardiac: RRR, 3/6 murmur with radiation to bilateral carotid arteries, no rubs, or gallops. No clubbing, cyanosis, edema.  Radials/DP/PT 2+ and equal bilaterally.  Respiratory:  Respirations regular and unlabored, clear to auscultation bilaterally. GI: Soft, nontender, nondistended, BS + x 4. MS: no deformity or atrophy. Skin: warm and dry, no rash. Neuro:  Strength and sensation are intact. Psych: Normal  affect.  Accessory Clinical Findings    ECG personally reviewed by me today -    - no acute changes.   No results found for: "WBC", "HGB", "HCT", "MCV", "PLT" No results found for: "CREATININE", "BUN", "NA", "K", "CL", "CO2" No results found for: "ALT", "AST", "GGT", "ALKPHOS", "BILITOT" No results found for: "CHOL", "HDL", "LDLCALC", "LDLDIRECT", "TRIG", "CHOLHDL"  No results found for: "HGBA1C"  Assessment & Plan   1. Aortic stenosis: Most recent echo in 03/2021 showed EF greater than 75%, hyperdynamic LV function, no RWMA, normal RV systolic function, mild to moderate mitral valve regurgitation, mild to moderate tricuspid valve regurgitation, mild aortic stenosis, mean gradient 14 mmHg, aortic valve regurgitation, no significant change from prior echo. Recent shortness of breath as noted below.  Euvolemic and well compensated on exam. Will repeat echocardiogram for monitoring of valvular heart disease, and in the setting of recent shortness of breath.  2. History of mitral valve prolapse/mitral valve regurgitation: Stable on most recent echo. Repeat echo pending as above.   3. Hypertension: Recently started on amlodipine. BP well controlled. Continue current antihypertensive regimen.   4. Hyperlipidemia/coronary calcification noted on CT/shortness of breath: Mild coronary artery calcification noted on recent CT chest. She has had recent shortness of breath in the setting of stress and anxiety, she denies exertional dyspnea or other symptoms concerning for angina.  Repeat echo pending as above.  No indication for ischemic evaluation at this time.  LDL was 135 in 05/2022.  Monitored and managed per PCP.  Patient declines statin therapy at this time.  Encouraged ongoing lifestyle modifications with diet and exercise.  6. Disposition: Follow-up in 6 months, sooner if needed.       Joylene Grapes, NP 04/25/2023, 4:34 PM

## 2023-06-02 ENCOUNTER — Ambulatory Visit (HOSPITAL_COMMUNITY): Payer: PPO | Attending: Nurse Practitioner

## 2023-06-02 DIAGNOSIS — I340 Nonrheumatic mitral (valve) insufficiency: Secondary | ICD-10-CM | POA: Insufficient documentation

## 2023-06-02 DIAGNOSIS — I350 Nonrheumatic aortic (valve) stenosis: Secondary | ICD-10-CM | POA: Insufficient documentation

## 2023-06-02 LAB — ECHOCARDIOGRAM COMPLETE
AR max vel: 1 cm2
AV Area VTI: 0.99 cm2
AV Area mean vel: 0.96 cm2
AV Mean grad: 21 mm[Hg]
AV Peak grad: 37.2 mm[Hg]
Ao pk vel: 3.05 m/s
Area-P 1/2: 3.37 cm2
P 1/2 time: 424 ms
S' Lateral: 2.7 cm

## 2023-06-03 ENCOUNTER — Other Ambulatory Visit: Payer: Self-pay | Admitting: Family Medicine

## 2023-06-03 DIAGNOSIS — Z1231 Encounter for screening mammogram for malignant neoplasm of breast: Secondary | ICD-10-CM

## 2023-06-05 ENCOUNTER — Ambulatory Visit
Admission: RE | Admit: 2023-06-05 | Discharge: 2023-06-05 | Disposition: A | Discharge status: Home / Self Care | Payer: PPO | Source: Ambulatory Visit | Attending: Family Medicine | Admitting: Family Medicine

## 2023-06-05 DIAGNOSIS — Z1231 Encounter for screening mammogram for malignant neoplasm of breast: Secondary | ICD-10-CM

## 2023-06-13 ENCOUNTER — Telehealth: Payer: Self-pay

## 2023-06-13 NOTE — Telephone Encounter (Signed)
Lmom to discuss echo results. Waiting on a return call. 

## 2023-06-24 NOTE — Telephone Encounter (Signed)
 Spoke with pt. Pt was notified of echo results and echo results were sent to mychart for pt to review.
